# Patient Record
Sex: Female | Born: 1950
Health system: Southern US, Community
[De-identification: ages and names within clinical notes are randomized; demographics above are authoritative.]

## PROBLEM LIST (undated history)

## (undated) DIAGNOSIS — R011 Cardiac murmur, unspecified: Secondary | ICD-10-CM

## (undated) DIAGNOSIS — Z8719 Personal history of other diseases of the digestive system: Secondary | ICD-10-CM

## (undated) DIAGNOSIS — T7840XA Allergy, unspecified, initial encounter: Secondary | ICD-10-CM

## (undated) DIAGNOSIS — F419 Anxiety disorder, unspecified: Secondary | ICD-10-CM

## (undated) DIAGNOSIS — J189 Pneumonia, unspecified organism: Secondary | ICD-10-CM

## (undated) DIAGNOSIS — M810 Age-related osteoporosis without current pathological fracture: Secondary | ICD-10-CM

## (undated) DIAGNOSIS — I493 Ventricular premature depolarization: Secondary | ICD-10-CM

## (undated) DIAGNOSIS — E785 Hyperlipidemia, unspecified: Secondary | ICD-10-CM

## (undated) DIAGNOSIS — I10 Essential (primary) hypertension: Secondary | ICD-10-CM

## (undated) DIAGNOSIS — K219 Gastro-esophageal reflux disease without esophagitis: Secondary | ICD-10-CM

## (undated) DIAGNOSIS — K649 Unspecified hemorrhoids: Secondary | ICD-10-CM

## (undated) HISTORY — DX: Hyperlipidemia, unspecified: E78.5

## (undated) HISTORY — PX: ABDOMINAL HYSTERECTOMY: SHX81

## (undated) HISTORY — DX: Anxiety disorder, unspecified: F41.9

## (undated) HISTORY — DX: Cardiac murmur, unspecified: R01.1

## (undated) HISTORY — DX: Personal history of other diseases of the digestive system: Z87.19

## (undated) HISTORY — DX: Ventricular premature depolarization: I49.3

## (undated) HISTORY — DX: Allergy, unspecified, initial encounter: T78.40XA

## (undated) HISTORY — DX: Age-related osteoporosis without current pathological fracture: M81.0

## (undated) HISTORY — PX: OTHER SURGICAL HISTORY: SHX169

---

## 1999-12-26 ENCOUNTER — Emergency Department (HOSPITAL_COMMUNITY): Admission: EM | Admit: 1999-12-26 | Discharge: 1999-12-26 | Payer: Self-pay | Admitting: Emergency Medicine

## 2000-02-08 ENCOUNTER — Encounter: Admission: RE | Admit: 2000-02-08 | Discharge: 2000-02-08 | Payer: Self-pay | Admitting: *Deleted

## 2000-02-08 ENCOUNTER — Encounter: Payer: Self-pay | Admitting: *Deleted

## 2000-02-16 ENCOUNTER — Encounter: Admission: RE | Admit: 2000-02-16 | Discharge: 2000-03-01 | Payer: Self-pay | Admitting: Internal Medicine

## 2001-01-17 ENCOUNTER — Encounter: Admission: RE | Admit: 2001-01-17 | Discharge: 2001-01-17 | Payer: Self-pay | Admitting: Internal Medicine

## 2012-02-07 DIAGNOSIS — J189 Pneumonia, unspecified organism: Secondary | ICD-10-CM

## 2012-02-07 DIAGNOSIS — Z0271 Encounter for disability determination: Secondary | ICD-10-CM

## 2012-02-07 HISTORY — DX: Pneumonia, unspecified organism: J18.9

## 2012-02-23 ENCOUNTER — Emergency Department (HOSPITAL_COMMUNITY): Payer: BC Managed Care – PPO

## 2012-02-23 ENCOUNTER — Observation Stay (HOSPITAL_COMMUNITY)
Admission: EM | Admit: 2012-02-23 | Discharge: 2012-02-23 | Discharge status: Against Medical Advice | Payer: BC Managed Care – PPO | Attending: Emergency Medicine | Admitting: Emergency Medicine

## 2012-02-23 ENCOUNTER — Encounter (HOSPITAL_COMMUNITY): Payer: Self-pay | Admitting: *Deleted

## 2012-02-23 DIAGNOSIS — R109 Unspecified abdominal pain: Secondary | ICD-10-CM

## 2012-02-23 DIAGNOSIS — I10 Essential (primary) hypertension: Secondary | ICD-10-CM | POA: Insufficient documentation

## 2012-02-23 DIAGNOSIS — R42 Dizziness and giddiness: Secondary | ICD-10-CM | POA: Insufficient documentation

## 2012-02-23 DIAGNOSIS — R1013 Epigastric pain: Principal | ICD-10-CM | POA: Insufficient documentation

## 2012-02-23 DIAGNOSIS — Z8249 Family history of ischemic heart disease and other diseases of the circulatory system: Secondary | ICD-10-CM | POA: Insufficient documentation

## 2012-02-23 HISTORY — DX: Essential (primary) hypertension: I10

## 2012-02-23 LAB — COMPREHENSIVE METABOLIC PANEL
Alkaline Phosphatase: 76 U/L (ref 39–117)
BUN: 12 mg/dL (ref 6–23)
CO2: 28 mEq/L (ref 19–32)
GFR calc Af Amer: >90 mL/min (ref >90)
GFR calc non Af Amer: >90 mL/min (ref >90)
Glucose, Bld: 99 mg/dL (ref 70–99)
Potassium: 3.7 mEq/L (ref 3.5–5.1)
Total Bilirubin: 0.3 mg/dL (ref 0.3–1.2)
Total Protein: 7.4 g/dL (ref 6.0–8.3)

## 2012-02-23 LAB — CBC
HCT: 39.7 % (ref 36.0–46.0)
Hemoglobin: 13.7 g/dL (ref 12.0–15.0)
MCHC: 34.5 g/dL (ref 30.0–36.0)

## 2012-02-23 LAB — TROPONIN I: Troponin I: <0.3 ng/mL (ref <0.30)

## 2012-02-23 MED ORDER — SODIUM CHLORIDE 0.9 % IV BOLUS (SEPSIS)
1000.0000 mL | Freq: Once | INTRAVENOUS | Status: AC
  Administered 2012-02-23: 1000 mL via INTRAVENOUS

## 2012-02-23 MED ORDER — ASPIRIN 81 MG PO CHEW
162.0000 mg | CHEWABLE_TABLET | Freq: Once | ORAL | Status: AC
  Administered 2012-02-23: 162 mg via ORAL
  Filled 2012-02-23: qty 2

## 2012-02-23 MED ORDER — ASPIRIN EC 325 MG PO TBEC: 325.0000 mg | DELAYED_RELEASE_TABLET | Freq: Every day | ORAL | Status: DC

## 2012-02-23 MED ORDER — FAMOTIDINE 20 MG PO TABS: 20.0000 mg | ORAL_TABLET | Freq: Two times a day (BID) | ORAL | Status: DC

## 2012-02-23 NOTE — ED Provider Notes (Addendum)
History     CSN: [id number]  Arrival date & time 02/23/12  1245   First MD Initiated Contact with Patient 02/23/12 1346      Chief Complaint  Patient presents with  . Dizziness    (Consider location/radiation/quality/duration/timing/severity/associated sxs/prior treatment) HPI Comments: Pt comes in with cc of dizziness and some epigastric discomfort. Pt has been having this epigastric discomfort for 2 weeks now, they are intermittent episodes, not related to po intake and with no specific aggravating or relieving factors associated with them. There is no associated n/v, but she does get dizzy (described as lightheadedness), and some clammy/diophoretic feeling with them. There is no cough, no sob, no hx of PE, DVT, or risk factors for the same. She had seen a pcp for this, and was started on some GI cocktail and was informed that if her sx dont resolve, she soul consider going to the ER. No previous cardiac testing, social hx is negative, family hx of CAD (father - 10s-70s).  The history is provided by the patient.    Past Medical History  Diagnosis Date  . Hypertension     Past Surgical History  Procedure Date  . Bunyonectomy   . Abdominal hysterectomy     No family history on file.  History  Substance Use Topics  . Smoking status: Never Smoker   . Smokeless tobacco: Not on file  . Alcohol Use: Yes     occa    OB History    Grav Para Term Preterm Abortions TAB SAB Ect Mult Living                  Review of Systems  Constitutional: Negative for activity change.  HENT: Negative for neck pain.   Respiratory: Negative for shortness of breath.   Cardiovascular: Negative for chest pain.  Gastrointestinal: Negative for nausea, vomiting and abdominal pain.  Genitourinary: Negative for dysuria.  Neurological: Positive for dizziness and light-headedness. Negative for tremors, syncope, weakness and headaches.    Allergies  Review of patient's allergies indicates  no known allergies.  Home Medications   Current Outpatient Rx  Name Route Sig Dispense Refill  . ACETAMINOPHEN 500 MG PO TABS Oral Take 500 mg by mouth every 6 (six) hours as needed. headache    . LOSARTAN POTASSIUM 25 MG PO TABS Oral Take 25 mg by mouth daily.    . NEOMYCIN-POLYMYXIN-DEXAMETH 3.5-10000-0.1 OP SUSP  1 drop every 6 (six) hours. For 7 days starting on 10/12      BP 131/87  Pulse 76  Temp 98.8 F (37.1 C) (Oral)  Resp 20  SpO2 98%  Physical Exam  Nursing note and vitals reviewed. Constitutional: She is oriented to person, place, and time. She appears well-developed and well-nourished.  HENT:  Head: Normocephalic and atraumatic.  Eyes: EOM are normal. Pupils are equal, round, and reactive to light.  Neck: Neck supple.  Cardiovascular: Normal rate, regular rhythm and normal heart sounds.   No murmur heard. Pulmonary/Chest: Effort normal. No respiratory distress.  Abdominal: Soft. She exhibits no distension. There is no tenderness. There is no rebound and no guarding.  Neurological: She is alert and oriented to person, place, and time.  Skin: Skin is warm and dry.    ED Course  Procedures (including critical care time)   Labs Reviewed  CBC  COMPREHENSIVE METABOLIC PANEL   No results found.   No diagnosis found.    MDM   Date: 02/23/2012  Rate: 71  Rhythm: normal  sinus rhythm  QRS Axis: normal  Intervals: normal  ST/T Wave abnormalities: normal  Conduction Disutrbances: LAFB  Narrative Interpretation: unremarkable, no comparison  Differential diagnosis includes: ACS syndrome CHF exacerbation Valvular disorder Myocarditis Pericarditis Pericardial effusion Pneumonia Pleural effusion Pulmonary edema PE Anemia Musculoskeletal pain  Pt comes in with cc of epigastric discomfort. She has been having these sx for 2 weeks, saw a doctor who gave her GI meds, and stated that if sx didn't respond she should come to the ED. Pt has intermittent sx,  not consistent with GERD. The sx truly are very atypical, but she has asspciated clammy feeling, dizziness, nervous feeling. No concerns for PE clinically. Cardiac risk factors  - Age, HTN and family hx of CAD in the 17s and 25s with no previous workup. Probably a good candidate for chest pain protocol. Will r/o orthostasis for her dizziness (no syncope).  Derwood Kaplan, MD 02/23/12 1347  CXR shows some concerns for pulm HTN. I think the CT angio chest should help better differentiate this as well. Transferring to [facility] - chest pain protocol. PCP f/u is on Tuesday per patient.  Derwood Kaplan, MD 02/23/12 1700

## 2012-02-23 NOTE — ED Notes (Signed)
Pt states she does not want to stay and be treated any longer. States she feels fine and wants to go home. Pt denies chest pain, nausea, shortness of breath, or dizziness. VSS. MD notified.

## 2012-02-23 NOTE — ED Notes (Addendum)
Pt reports intermittent dizziness, chest tightness, and feeling tired x 2 weeks now.  Reports being seen at [facility] last week for same.  Pt reports she was given eye gtts without relief and has not felt better.  Pt also reports chest tightness and palpitation but states that it's located in her epigastric area "like indigestion."  Pt also reports blurred vision.

## 2012-02-23 NOTE — ED Notes (Signed)
Pt denies chest pain at present. Denies shortness of breath, nausea.

## 2012-02-23 NOTE — ED Notes (Signed)
MD at bedside. Dr. Bednar at bedside.  

## 2012-03-05 ENCOUNTER — Other Ambulatory Visit: Payer: Self-pay

## 2012-03-05 ENCOUNTER — Encounter (HOSPITAL_COMMUNITY): Payer: Self-pay | Admitting: *Deleted

## 2012-03-05 ENCOUNTER — Observation Stay (HOSPITAL_COMMUNITY)
Admission: EM | Admit: 2012-03-05 | Discharge: 2012-03-07 | Disposition: A | Discharge status: Home / Self Care | Payer: BC Managed Care – PPO | Attending: Family Medicine | Admitting: Family Medicine

## 2012-03-05 DIAGNOSIS — Z792 Long term (current) use of antibiotics: Secondary | ICD-10-CM | POA: Insufficient documentation

## 2012-03-05 DIAGNOSIS — R1013 Epigastric pain: Secondary | ICD-10-CM | POA: Insufficient documentation

## 2012-03-05 DIAGNOSIS — J189 Pneumonia, unspecified organism: Secondary | ICD-10-CM | POA: Insufficient documentation

## 2012-03-05 DIAGNOSIS — R5383 Other fatigue: Secondary | ICD-10-CM | POA: Insufficient documentation

## 2012-03-05 DIAGNOSIS — R079 Chest pain, unspecified: Secondary | ICD-10-CM | POA: Insufficient documentation

## 2012-03-05 DIAGNOSIS — R42 Dizziness and giddiness: Secondary | ICD-10-CM

## 2012-03-05 DIAGNOSIS — R55 Syncope and collapse: Principal | ICD-10-CM | POA: Diagnosis present

## 2012-03-05 DIAGNOSIS — R011 Cardiac murmur, unspecified: Secondary | ICD-10-CM | POA: Diagnosis present

## 2012-03-05 DIAGNOSIS — R5381 Other malaise: Secondary | ICD-10-CM | POA: Insufficient documentation

## 2012-03-05 DIAGNOSIS — M1990 Unspecified osteoarthritis, unspecified site: Secondary | ICD-10-CM | POA: Insufficient documentation

## 2012-03-05 DIAGNOSIS — Z23 Encounter for immunization: Secondary | ICD-10-CM | POA: Insufficient documentation

## 2012-03-05 DIAGNOSIS — I10 Essential (primary) hypertension: Secondary | ICD-10-CM | POA: Insufficient documentation

## 2012-03-05 HISTORY — DX: Gastro-esophageal reflux disease without esophagitis: K21.9

## 2012-03-05 HISTORY — DX: Pneumonia, unspecified organism: J18.9

## 2012-03-05 MED ORDER — GI COCKTAIL ~~LOC~~
30.0000 mL | Freq: Once | ORAL | Status: AC
  Administered 2012-03-06: 30 mL via ORAL
  Filled 2012-03-05: qty 30

## 2012-03-05 NOTE — ED Notes (Signed)
The pt is c/o of dizziness and feeling unwell for over one month. She has been seen at the [facility] and also dr bland with a diagnosis of pneumonia.  No distress at present

## 2012-03-06 ENCOUNTER — Emergency Department (HOSPITAL_COMMUNITY): Payer: BC Managed Care – PPO

## 2012-03-06 ENCOUNTER — Encounter (HOSPITAL_COMMUNITY): Payer: Self-pay | Admitting: Internal Medicine

## 2012-03-06 DIAGNOSIS — R55 Syncope and collapse: Principal | ICD-10-CM

## 2012-03-06 DIAGNOSIS — R42 Dizziness and giddiness: Secondary | ICD-10-CM

## 2012-03-06 DIAGNOSIS — R011 Cardiac murmur, unspecified: Secondary | ICD-10-CM

## 2012-03-06 DIAGNOSIS — R079 Chest pain, unspecified: Secondary | ICD-10-CM

## 2012-03-06 LAB — COMPREHENSIVE METABOLIC PANEL
Albumin: 4.1 g/dL (ref 3.5–5.2)
Alkaline Phosphatase: 89 U/L (ref 39–117)
BUN: 19 mg/dL (ref 6–23)
Calcium: 9.7 mg/dL (ref 8.4–10.5)
GFR calc Af Amer: >90 mL/min (ref >90)
Glucose, Bld: 132 mg/dL — ABNORMAL HIGH (ref 70–99)
Potassium: 3.3 mEq/L — ABNORMAL LOW (ref 3.5–5.1)
Sodium: 134 mEq/L — ABNORMAL LOW (ref 135–145)
Total Protein: 7.8 g/dL (ref 6.0–8.3)

## 2012-03-06 LAB — CBC WITH DIFFERENTIAL/PLATELET
Basophils Relative: 0 % (ref 0–1)
Eosinophils Absolute: 0.2 10*3/uL (ref 0.0–0.7)
Eosinophils Relative: 3 % (ref 0–5)
MCH: 31 pg (ref 26.0–34.0)
MCHC: 35.8 g/dL (ref 30.0–36.0)
MCV: 86.6 fL (ref 78.0–100.0)
Monocytes Relative: 9 % (ref 3–12)
Neutrophils Relative %: 48 % (ref 43–77)
Platelets: 325 10*3/uL (ref 150–400)

## 2012-03-06 LAB — CBC
HCT: 37.9 % (ref 36.0–46.0)
Hemoglobin: 13.5 g/dL (ref 12.0–15.0)
MCHC: 35.6 g/dL (ref 30.0–36.0)
RBC: 4.31 MIL/uL (ref 3.87–5.11)
WBC: 6.8 10*3/uL (ref 4.0–10.5)

## 2012-03-06 LAB — CREATININE, SERUM
Creatinine, Ser: 0.66 mg/dL (ref 0.50–1.10)
GFR calc Af Amer: >90 mL/min (ref >90)
GFR calc non Af Amer: >90 mL/min (ref >90)

## 2012-03-06 LAB — TROPONIN I: Troponin I: <0.3 ng/mL (ref <0.30)

## 2012-03-06 MED ORDER — ONDANSETRON HCL 4 MG/2ML IJ SOLN
4.0000 mg | Freq: Once | INTRAMUSCULAR | Status: AC
  Administered 2012-03-06: 4 mg via INTRAVENOUS
  Filled 2012-03-06: qty 2

## 2012-03-06 MED ORDER — FAMOTIDINE 20 MG PO TABS
20.0000 mg | ORAL_TABLET | Freq: Two times a day (BID) | ORAL | Status: DC
  Administered 2012-03-06 – 2012-03-07 (×3): 20 mg via ORAL
  Filled 2012-03-06 (×4): qty 1

## 2012-03-06 MED ORDER — SODIUM CHLORIDE 0.9 % IV BOLUS (SEPSIS)
1000.0000 mL | Freq: Once | INTRAVENOUS | Status: AC
  Administered 2012-03-06: 1000 mL via INTRAVENOUS

## 2012-03-06 MED ORDER — HEPARIN SODIUM (PORCINE) 5000 UNIT/ML IJ SOLN
5000.0000 [IU] | Freq: Three times a day (TID) | INTRAMUSCULAR | Status: DC
  Administered 2012-03-06 – 2012-03-07 (×3): 5000 [IU] via SUBCUTANEOUS
  Filled 2012-03-06 (×6): qty 1

## 2012-03-06 MED ORDER — AZITHROMYCIN 250 MG PO TABS
250.0000 mg | ORAL_TABLET | Freq: Every day | ORAL | Status: DC
  Administered 2012-03-06 – 2012-03-07 (×2): 250 mg via ORAL
  Filled 2012-03-06 (×2): qty 1

## 2012-03-06 MED ORDER — ACETAMINOPHEN 500 MG PO TABS
500.0000 mg | ORAL_TABLET | Freq: Four times a day (QID) | ORAL | Status: DC | PRN
  Filled 2012-03-06: qty 1

## 2012-03-06 MED ORDER — ASPIRIN EC 325 MG PO TBEC
325.0000 mg | DELAYED_RELEASE_TABLET | Freq: Every day | ORAL | Status: DC
  Administered 2012-03-06 – 2012-03-07 (×2): 325 mg via ORAL
  Filled 2012-03-06 (×2): qty 1

## 2012-03-06 MED ORDER — PNEUMOCOCCAL VAC POLYVALENT 25 MCG/0.5ML IJ INJ
0.5000 mL | INJECTION | INTRAMUSCULAR | Status: AC
  Administered 2012-03-07: 0.5 mL via INTRAMUSCULAR
  Filled 2012-03-06: qty 0.5

## 2012-03-06 MED ORDER — ATROPINE SULFATE 0.1 MG/ML IJ SOLN
INTRAMUSCULAR | Status: AC
  Filled 2012-03-06: qty 10

## 2012-03-06 MED ORDER — SODIUM CHLORIDE 0.9 % IV BOLUS (SEPSIS)
500.0000 mL | Freq: Once | INTRAVENOUS | Status: AC
  Administered 2012-03-06: 500 mL via INTRAVENOUS

## 2012-03-06 MED ORDER — INFLUENZA VIRUS VACC SPLIT PF IM SUSP
0.5000 mL | INTRAMUSCULAR | Status: AC
  Administered 2012-03-07: 0.5 mL via INTRAMUSCULAR
  Filled 2012-03-06: qty 0.5

## 2012-03-06 MED ORDER — SODIUM CHLORIDE 0.9 % IJ SOLN
3.0000 mL | Freq: Two times a day (BID) | INTRAMUSCULAR | Status: DC
  Administered 2012-03-06: 3 mL via INTRAVENOUS

## 2012-03-06 MED ORDER — SODIUM CHLORIDE 0.9 % IV SOLN
INTRAVENOUS | Status: AC
  Administered 2012-03-06: 13:00:00 via INTRAVENOUS

## 2012-03-06 NOTE — ED Notes (Signed)
Patient currently resting quietly in bed; no respiratory or acute distress noted.  Patient updated on plan of care; informed patient that we are currently waiting on x-ray results to come back.  Patient has no other questions or concerns at this time; will continue to monitor.

## 2012-03-06 NOTE — ED Notes (Signed)
Dr. Bonk at bedside 

## 2012-03-06 NOTE — ED Notes (Signed)
Admitting MD at bedside.

## 2012-03-06 NOTE — ED Notes (Addendum)
While RN attempting to start IV access to administer Zofran, patient became very diaphoretic, clammy, blood pressure dropped (60 systolic), and HR dropped (40s-50s).  EDP notified and at bedside.  Patient difficult to arouse at this time.  Patient reports that this is similar to these "episodes" that she has been getting during the last month.  Repeat EKG done.  1 liter NS bolus started, per EDP verbal order verified by read back.  Non-rebreather placed on patient at 15 liters per minute.

## 2012-03-06 NOTE — ED Notes (Signed)
Patient currently resting quietly in bed; no respiratory or acute distress noted.  Patient updated on plan of care; informed patient that we are currently waiting on further orders from EDP.  Patient denies any needs at this time; will continue to monitor. 

## 2012-03-06 NOTE — ED Notes (Signed)
Patient has bee placed on Zoll pads; patient still diaphoretic and clammy at this time.  Coming in and out of consciousness.

## 2012-03-06 NOTE — ED Notes (Signed)
Normal Saline 500 mL bolus finished infusing.

## 2012-03-06 NOTE — ED Provider Notes (Signed)
History     CSN: [id number]  Arrival date & time 03/05/12  2322   First MD Initiated Contact with Patient 03/05/12 2337      Chief Complaint  Patient presents with  . Chest Pain    (Consider location/radiation/quality/duration/timing/severity/associated sxs/prior treatment) HPI Victoria Hogan is a 61 y.o. female present with a one-month history of dizziness, general malaise, epigastric "pressure" was also been evaluated 2 weeks ago in emergency department. That time she had cardiac workup which was negative-she chose to pursue an outpatient workup and saw her primary care physician. Primary care physician changed her blood pressure medication to Tribenzor - however her symptoms persisted. She says his pressures a 6-7/10, it is fairly constant, and does wane occasionally,has no known exacerbating or alleviating factors. Is been some associated nausea. She has been dizzy denies any sweats, radiation. No vomiting no diarrhea, no fevers no chills. Eyes any focal neurologic deficits, difficulties walking, talking or lightheadedness.   Past Medical History  Diagnosis Date  . Hypertension     Past Surgical History  Procedure Date  . Bunyonectomy   . Abdominal hysterectomy     No family history on file.  History  Substance Use Topics  . Smoking status: Never Smoker   . Smokeless tobacco: Not on file  . Alcohol Use: Yes     occa    OB History    Grav Para Term Preterm Abortions TAB SAB Ect Mult Living                  Review of Systems At least 10pt or greater review of systems completed and are negative except where specified in the HPI.  Allergies  Review of patient's allergies indicates no known allergies.  Home Medications   Current Outpatient Rx  Name Route Sig Dispense Refill  . ACETAMINOPHEN 500 MG PO TABS Oral Take 500 mg by mouth every 6 (six) hours as needed. headache    . ASPIRIN EC 325 MG PO TBEC Oral Take 1 tablet (325 mg total) by mouth daily. 30 tablet 0    . AZITHROMYCIN 250 MG PO TABS Oral Take 250 mg by mouth daily.    Marland Kitchen FAMOTIDINE 20 MG PO TABS Oral Take 1 tablet (20 mg total) by mouth 2 (two) times daily. 10 tablet 0  . NEOMYCIN-POLYMYXIN-DEXAMETH 3.5-10000-0.1 OP SUSP  1 drop every 6 (six) hours. For 7 days starting on 10/12    . OLMESARTAN-AMLODIPINE-HCTZ 20-5-12.5 MG PO TABS Oral Take by mouth.      BP 133/98  Pulse 92  Temp 98.1 F (36.7 C) (Oral)  Resp 19  SpO2 98%  Physical Exam  Nursing notes reviewed.  Electronic medical record reviewed. VITAL SIGNS:   Filed Vitals:   03/06/12 0430 03/06/12 0514 03/06/12 0515 03/06/12 0627  BP: 109/55 98/57 98/57  101/64  Pulse: 83 66 65 82  Temp:  98.3 F (36.8 C)    TempSrc:  Oral    Resp: 13 14 13 20   SpO2: 100% 100% 100% 98%   CONSTITUTIONAL: Awake, oriented, appears non-toxic HENT: Atraumatic, normocephalic, oral mucosa pink and moist, airway patent. Nares patent without drainage. External ears normal. EYES: Conjunctiva clear, EOMI, PERRLA NECK: Trachea midline, non-tender, supple CARDIOVASCULAR: Normal heart rate, Normal rhythm, 2/6 diastolic murmur left upper sternal border, no rubs or gallops PULMONARY/CHEST: Clear to auscultation, no rhonchi, wheezes, or rales. Symmetrical breath sounds. Non-tender. ABDOMINAL: Non-distended, soft, non-tender - no rebound or guarding.  BS normal. NEUROLOGIC: Non-focal, moving all four  extremities, no gross sensory or motor deficits. EXTREMITIES: No clubbing, cyanosis, or edema SKIN: Warm, Dry, No erythema, No rash   ED Course  CRITICAL CARE Performed by: Jones Skene Authorized by: Jones Skene Total critical care time: 30 minutes Critical care time was exclusive of separately billable procedures and treating other patients. Critical care was necessary to treat or prevent imminent or life-threatening deterioration of the following conditions: circulatory failure. Critical care was time spent personally by me on the following  activities: discussions with consultants, development of treatment plan with patient or surrogate, examination of patient, ordering and performing treatments and interventions, re-evaluation of patient's condition, review of old charts, pulse oximetry, ordering and review of laboratory studies, obtaining history from patient or surrogate and evaluation of patient's response to treatment.   (including critical care time)   Date: 03/06/2012  Rate: 76  Rhythm: sinus rhythm  QRS Axis: LAD  Intervals: normal  ST/T Wave abnormalities: normal  Conduction Disutrbances: LAFB  Narrative Interpretation: Nonacute EKG, nonischemic, no significant changes when compared with prior EKG dated 02/23/2012    Labs Reviewed  CBC WITH DIFFERENTIAL - Abnormal; Notable for the following:    RDW 11.4 (*)     All other components within normal limits  COMPREHENSIVE METABOLIC PANEL - Abnormal; Notable for the following:    Sodium 134 (*)     Potassium 3.3 (*)     Glucose, Bld 132 (*)     GFR calc non Af Amer 90 (*)     All other components within normal limits  TROPONIN I  CBC  CREATININE, SERUM  TROPONIN I  TROPONIN I  TROPONIN I   Dg Chest 2 View  03/06/2012  *RADIOLOGY REPORT*  Clinical Data: Chest pain and a T.  CHEST - 2 VIEW  Comparison: Plain films of the chest 02/23/2012.  Findings: Streaky of opacities are identified in the lingula and right middle lobe.  Heart size is normal.  No pneumothorax or pleural fluid is identified.  IMPRESSION: Streaky opacities in the lingula and right middle lobe could be due to atelectasis or possibly scar.  Pneumonia is felt less likely.   Original Report Authenticated By: Bernadene Bell. D'ALESSIO, M.D.      1. Chest pain   2. Syncope   3. Dizziness   4. Heart murmur       MDM  Victoria Hogan is a 61 y.o. female presenting to the emergency department for a epigastric pressure is been going on for about 4 weeks however is been associated with more dizziness  lightheadedness and general malaise over the course of the last couple of days. She was evaluated previously in emergency department and also obtained an outpatient CT. There are some concerns from the CT and prior chest x-rays that she may have some pulmonary hypertension - CT obtained of the chest was not with contrast. At this point I do not think she has got a pulmonary embolism, pretest probability is exceedingly low, I do not think she's got acute coronary syndrome either. EKG shows pulmonary type disease pattern with a mild left axis deviation but nothing acute.  Obtain some basic labs and a chest x-ray. Patient has been treated for pneumonia that was seen on CT.  During the patient's ER visit, she became extremely bradycardic into the upper 20s and 30s diaphoretic and unresponsive. Patient was given fluid bolus and recovered prior to receiving atropine. No other arrhythmias noted, second EKG showed sinus bradycardia.  Troponins negative.  Admit  patient for chest pain rule out - discussed with Dr. Bennie Pierini, MD 03/06/12 [phone]

## 2012-03-06 NOTE — ED Notes (Signed)
IV team called for second IV start; multiple RNs attempted with no success.

## 2012-03-06 NOTE — ED Notes (Signed)
Patient alert and oriented x4 at this time; speaking in full sentences.  Attempting second IV start at this time.

## 2012-03-06 NOTE — H&P (Signed)
[facility] History and Physical  Victoria Hogan:[id number] DOB: 1951-01-10 DOA: 03/05/2012  Referring physician: ED PCP: No primary provider on file.  Specialists: None  Chief Complaint: Presyncope  HPI: Victoria Hogan is a 61 y.o. female who presents with complaints of lightheadedness and feeling like she was going to pass out for the past 3 weeks, symptoms worsened since her blood pressure medication was increased on Wed of this week.  Symptoms are worse when going from a seated to a standing position, and were dramatically worse when they drew blood in the ED (actually had vasovagal syncopal episode in ED while on monitor while having blood drawn).  The ED has asked that hospitalist service admit the patient for her ongoing presyncope for a cardiac workup.  Review of Systems: patient denies CP, DOE, arm pain, jaw pain, has been having mild SOB recently as she was diagnosed with PNA on Friday and started on zithromax for this 12 systems reviewed and otherwise negative.  Past Medical History  Diagnosis Date  . Hypertension    Past Surgical History  Procedure Date  . Bunyonectomy   . Abdominal hysterectomy    Social History:  reports that she has never smoked. She does not have any smokeless tobacco history on file. She reports that she drinks alcohol. She reports that she does not use illicit drugs.   No Known Allergies  Family History  Problem Relation Age of Onset  . Heart failure Father     Prior to Admission medications   Medication Sig Start Date End Date Taking? Authorizing Provider  acetaminophen (TYLENOL) 500 MG tablet Take 500 mg by mouth every 6 (six) hours as needed. headache   Yes Historical Provider, MD  aspirin EC 325 MG tablet Take 1 tablet (325 mg total) by mouth daily. 02/23/12  Yes Hurman Horn, MD  azithromycin (ZITHROMAX) 250 MG tablet Take 250 mg by mouth daily.   Yes Historical Provider, MD  famotidine (PEPCID) 20 MG tablet Take 1 tablet (20  mg total) by mouth 2 (two) times daily. 02/23/12  Yes Hurman Horn, MD  neomycin-polymyxin b-dexamethasone (MAXITROL) 3.5-10000-0.1 SUSP 1 drop every 6 (six) hours. For 7 days starting on 10/12   Yes Historical Provider, MD  Olmesartan-Amlodipine-HCTZ (TRIBENZOR) 20-5-12.5 MG TABS Take by mouth.   Yes Historical Provider, MD   Physical Exam: Filed Vitals:   03/06/12 0430 03/06/12 0514 03/06/12 0515 03/06/12 0627  BP: 109/55 98/57 98/57  101/64  Pulse: 83 66 65 82  Temp:  98.3 F (36.8 C)    TempSrc:  Oral    Resp: 13 14 13 20   SpO2: 100% 100% 100% 98%    General:  NAD, resting comfortably in bed Eyes: PEERLA EOMI ENT: mucous membranes moist Neck: supple w/o JVD Cardiovascular: RRR there is a 3/6 SEM present, best heard at the L second intercostal space. Respiratory: CTA B Abdomen: soft, nt, nd, bs+ Skin: no rash nor lesion Musculoskeletal: MAE, full ROM all 4 extremities Psychiatric: normal tone and affect Neurologic: AAOx3, grossly non-focal  Labs on Admission:  Basic Metabolic Panel:  Lab 03/05/12 1478  NA 134*  K 3.3*  CL 97  CO2 25  GLUCOSE 132*  BUN 19  CREATININE 0.74  CALCIUM 9.7  MG --  PHOS --   Liver Function Tests:  Lab 03/05/12 2335  AST 16  ALT 18  ALKPHOS 89  BILITOT 0.4  PROT 7.8  ALBUMIN 4.1   No results found for this basename: LIPASE:5,AMYLASE:5  in the last 168 hours No results found for this basename: AMMONIA:5 in the last 168 hours CBC:  Lab 03/05/12 2335  WBC 6.3  NEUTROABS 3.0  HGB 14.6  HCT 40.8  MCV 86.6  PLT 325   Cardiac Enzymes:  Lab 03/05/12 2336  CKTOTAL --  CKMB --  CKMBINDEX --  TROPONINI <0.30    BNP (last 3 results) No results found for this basename: PROBNP:3 in the last 8760 hours CBG: No results found for this basename: GLUCAP:5 in the last 168 hours  Radiological Exams on Admission: Dg Chest 2 View  03/06/2012  *RADIOLOGY REPORT*  Clinical Data: Chest pain and a T.  CHEST - 2 VIEW  Comparison: Plain  films of the chest 02/23/2012.  Findings: Streaky of opacities are identified in the lingula and right middle lobe.  Heart size is normal.  No pneumothorax or pleural fluid is identified.  IMPRESSION: Streaky opacities in the lingula and right middle lobe could be due to atelectasis or possibly scar.  Pneumonia is felt less likely.   Original Report Authenticated By: Bernadene Bell. D'ALESSIO, M.D.     EKG: Independently reviewed.  Assessment/Plan Principal Problem:  *Syncope Active Problems:  Heart murmur   1. Presyncope at home with single vasovagal syncopal episode in ED - patients SBP in ED is only in the 90s to low 100s this while lying down, will order orthostatic vitals but likely has orthostatic hypotension given history and the fact she is on HCTZ which is well known to cause this, holding BP meds as I think these may be at the root cause of her symptoms.  Will also order serial troponins but I doubt ACS. 2. Heart murmur - no history of previous heart murmur, very possibly just a flow murmur given the low BP on exam but will order 2d echo never the less to make sure there isnt an underlying valve problem that could be responsible for her symptoms. 3. PNA diagnosed on Friday - will continue azithromycin while here.  Code Status: Full Family Communication: Spoke with patient and husband who is at bedside Disposition Plan: Admit to obs  Time spent: 50 min  [name] M. [facility] Pager [phone]  If 7PM-7AM, please contact night-coverage [email or web address] Password [email or web address] 03/06/2012, 7:02 AM

## 2012-03-06 NOTE — Progress Notes (Addendum)
[facility] PROGRESS NOTE  Victoria Hogan ZOX:[id number] DOB: November 20, 1950 DOA: 03/05/2012 PCP: Geraldo Pitter, MD  Assessment/Plan: Principal Problem:  *Syncope Active Problems:  Heart murmur    1. Presyncope/Syncope: Patient presented with  3 weeks of pre-syncopal symptoms, culminating in a vasovagal syncopal episode in ED. She was found to be significantly orthostatic. This clinical picture is consistent with volume-depletion/orthostsais, secondary to antihypertensive/diuretic. These culprit medications have been placed on hold, patient is being hydrated with iv NS, and she already feels better. She has no chest pain or SOB, and cardiac enzymes are unelevated. 12-lead EKG showed SR. 2. Heart murmur: This was an incidental finding on physical examination. 2D echocardiogram, has been done, but report is still pending. 3. PNA: Patient was diagnosed with CAP on 12/01/11, and commenced on Azithromycin, by her PMD. CXR revealed streaky opacities in the lingula and right middle lobe. Patient is on day# 5 of Azithromycin, has no cough or pyrexia, and wcc is normal. We shall complete 7-day course of antibiotic on 03/08/12.  4. History of HTN: See discussion in #1 above. Antihypertensives are on hold.   Code Status: Full Code.  Family Communication:  Disposition Plan: Possible discharge on 03/07/12.    Brief narrative: 61 y.o. female with history of HTN, TAH, s/p previous bunionectomy, recent CAP, on Azithromycin therapy since 03/02/22, presenting with lightheadedness and feeling like she was going to pass out for the past 3 weeks. These symptoms worsened since her blood pressure medication was increased on 02/29/12, and are worse when going from a seated to a standing position. In the ED, she actually had vasovagal syncopal episode in ED while on monitor, while having blood drawn. She was admitted for further evaluation and management.    Consultants:  N/A.   Procedures:  CXR.    Antibiotics:  Azithromycin 03/06/12>>>  HPI/Subjective: Feels better.   Objective: Vital signs in last 24 hours: Temp:  [98.1 F (36.7 C)-98.6 F (37 C)] 98.6 F (37 C) (10/29 1400) Pulse Rate:  [58-111] 73  (10/29 1400) Resp:  [12-20] 17  (10/29 1400) BP: (68-139)/(34-98) 98/63 mmHg (10/29 1400) SpO2:  [94 %-100 %] 97 % (10/29 1400) Weight:  [72.757 kg (160 lb 6.4 oz)] 72.757 kg (160 lb 6.4 oz) (10/29 0923) Weight change:  Last BM Date: 03/06/12  Intake/Output from previous day:   Total I/O In: 222 [P.O.:222] Out: -    Physical Exam: General: Comfortable, alert, communicative, fully oriented, not short of breath at rest.  HEENT:  No clinical pallor, no jaundice, no conjunctival injection or discharge. NECK:  Supple, JVP not seen, no carotid bruits, no palpable lymphadenopathy, no palpable goiter. CHEST:  Clinically clear to auscultation, no wheezes, no crackles. HEART:  Sounds 1 and 2 heard, normal, regular, no murmurs. ABDOMEN:  Full, soft, non-tender, no palpable organomegaly, no palpable masses, normal bowel sounds. GENITALIA:  Not examined. LOWER EXTREMITIES:  No pitting edema, palpable peripheral pulses. MUSCULOSKELETAL SYSTEM:  Generalized osteoarthritic changes, otherwise, normal. CENTRAL NERVOUS SYSTEM:  No focal neurologic deficit on gross examination.  Lab Results:  Basename 03/06/12 0714 03/05/12 2335  WBC 6.8 6.3  HGB 13.5 14.6  HCT 37.9 40.8  PLT 305 325    Basename 03/06/12 0714 03/05/12 2335  NA -- 134*  K -- 3.3*  CL -- 97  CO2 -- 25  GLUCOSE -- 132*  BUN -- 19  CREATININE 0.66 0.74  CALCIUM -- 9.7   No results found for this or any previous visit (from the past  240 hour(s)).   Studies/Results: Dg Chest 2 View  03/06/2012  *RADIOLOGY REPORT*  Clinical Data: Chest pain and a T.  CHEST - 2 VIEW  Comparison: Plain films of the chest 02/23/2012.  Findings: Streaky of opacities are identified in the lingula and right middle lobe.  Heart  size is normal.  No pneumothorax or pleural fluid is identified.  IMPRESSION: Streaky opacities in the lingula and right middle lobe could be due to atelectasis or possibly scar.  Pneumonia is felt less likely.   Original Report Authenticated By: Bernadene Bell. Maricela Curet, M.D.     Medications: Scheduled Meds:    . aspirin EC  325 mg Oral Daily  . atropine      . azithromycin  250 mg Oral Daily  . famotidine  20 mg Oral BID  . gi cocktail  30 mL Oral Once  . heparin  5,000 Units Subcutaneous Q8H  . influenza  inactive virus vaccine  0.5 mL Intramuscular Tomorrow-1000  . ondansetron (ZOFRAN) IV  4 mg Intravenous Once  . pneumococcal 23 valent vaccine  0.5 mL Intramuscular Tomorrow-1000  . sodium chloride  1,000 mL Intravenous Once  . sodium chloride  500 mL Intravenous Once  . sodium chloride  3 mL Intravenous Q12H   Continuous Infusions:    . sodium chloride 100 mL/hr at 03/06/12 1233   PRN Meds:.acetaminophen    LOS: 1 day   Earnest Thalman,CHRISTOPHER  [facility] Pager [phone]. If 8PM-8AM, please contact night-coverage at [email or web address], password [facility] 03/06/2012, 4:44 PM  LOS: 1 day

## 2012-03-07 LAB — BASIC METABOLIC PANEL
BUN: 9 mg/dL (ref 6–23)
CO2: 24 mEq/L (ref 19–32)
Chloride: 106 mEq/L (ref 96–112)
Creatinine, Ser: 0.7 mg/dL (ref 0.50–1.10)

## 2012-03-07 LAB — CBC
HCT: 35.3 % — ABNORMAL LOW (ref 36.0–46.0)
MCHC: 34 g/dL (ref 30.0–36.0)
MCV: 88.9 fL (ref 78.0–100.0)
RDW: 11.6 % (ref 11.5–15.5)
WBC: 5.6 10*3/uL (ref 4.0–10.5)

## 2012-03-07 MED ORDER — HYDRALAZINE HCL 10 MG PO TABS: 10.0000 mg | ORAL_TABLET | Freq: Three times a day (TID) | ORAL | Status: DC

## 2012-03-07 NOTE — Progress Notes (Signed)
DC orders received.  Patient stable with no S/S of distress.  Discharge and medication information reviewed with patient and patient's family member.  Patient DC home. Victoria Hogan

## 2012-03-07 NOTE — Evaluation (Signed)
Physical Therapy Evaluation Patient Details Name: Victoria Hogan MRN: [id number] DOB: 05-12-1950 Today's Date: 03/07/2012 Time: 4098-1191 PT Time Calculation (min): 31 min  PT Assessment / Plan / Recommendation Clinical Impression  pt presents with Orthostatic Hypotension with MD order for Vestibular eval.  Much of pt's symptoms have resolved and seem to be due to Hypotension, however pt does describe feeling woosey headed and having peripheral vision go blurry and progressively feeling worse during Hallpike testing to L.  Disucssed with MD concerns about any arterial insufficienciese eliciting these sensations as they don't seem to be Vestibular in origin.  Discussed with pt to f/u with primary MD.  No further PT needs at this time.      PT Assessment  Patent does not need any further PT services    Follow Up Recommendations  No PT follow up    Does the patient have the potential to tolerate intense rehabilitation      Barriers to Discharge None      Equipment Recommendations  None recommended by PT    Recommendations for Other Services     Frequency      Precautions / Restrictions Precautions Precautions: None Restrictions Weight Bearing Restrictions: No   Pertinent Vitals/Pain Denies pain.  Orthostatic Vitals checked: supine 100/66, sitting 106/72, and standing 108/69.        Mobility  Bed Mobility Bed Mobility: Supine to Sit;Sitting - Scoot to Edge of Bed;Sit to Supine Supine to Sit: 7: Independent Sitting - Scoot to Edge of Bed: 7: Independent Sit to Supine: 7: Independent Transfers Transfers: Sit to Stand;Stand to Sit Sit to Stand: 7: Independent;From bed Stand to Sit: 7: Independent;To bed Ambulation/Gait Ambulation/Gait Assistance: 7: Independent Ambulation Distance (Feet): 160 Feet Assistive device: None Gait Pattern: Within Functional Limits Stairs: No Wheelchair Mobility Wheelchair Mobility: No    Shoulder Instructions     Exercises     PT  Diagnosis:    PT Problem List:   PT Treatment Interventions:     PT Goals    Visit Information  Last PT Received On: 03/07/12 Assistance Needed: +1    Subjective Data  Subjective: It's hard to tell what is from my BP meds and what is different.   Patient Stated Goal: Home   Prior Functioning  Prior Function Level of Independence: Independent Able to Take Stairs?: Yes Driving: Yes Communication Communication: No difficulties    Cognition  Overall Cognitive Status: Appears within functional limits for tasks assessed/performed Arousal/Alertness: Awake/alert Orientation Level: Appears intact for tasks assessed Behavior During Session: [facility] for tasks performed    Extremity/Trunk Assessment Right Lower Extremity Assessment RLE ROM/Strength/Tone: [facility] for tasks assessed RLE Sensation: WFL - Light Touch RLE Coordination: WFL - gross/fine motor Left Lower Extremity Assessment LLE ROM/Strength/Tone: WFL for tasks assessed LLE Sensation: WFL - Light Touch LLE Coordination: WFL - gross/fine motor Trunk Assessment Trunk Assessment: Normal   Balance Balance Balance Assessed: No  End of Session PT - End of Session Activity Tolerance: Patient tolerated treatment well Patient left: in bed;with call bell/phone within reach (Sitting EOB) Nurse Communication: Mobility status  GP Functional Assessment Tool Used: Clinical Judgement Functional Limitation: Mobility: Walking and moving around Mobility: Walking and Moving Around Current Status (Y7829): 0 percent impaired, limited or restricted Mobility: Walking and Moving Around Goal Status (F6213): 0 percent impaired, limited or restricted Mobility: Walking and Moving Around Discharge Status (Y8657): 0 percent impaired, limited or restricted   Sunny Schlein, [place] [phone] 03/07/2012, 12:19 PM

## 2012-03-07 NOTE — Discharge Summary (Addendum)
Physician Discharge Summary  Victoria Hogan AVW:[id number] DOB: Aug 04, 1950 DOA: 03/05/2012  PCP: Geraldo Pitter, MD  Admit date: 03/05/2012 Discharge date: 03/07/2012  Time spent: 20 minutes  Recommendations for Outpatient Follow-up:  1. Follow blood pressure.  Would not place on diuretic/CCB unless absolutely indicated 2. Patient presented with some features of Vertigo from potentially a vestibular cause-PT/OT evaled her for this and found this non-contributory-consider out patient Carotid Duplex if symptoms persist 3. Heart murmur found to be non-contributory  Discharge Diagnoses:  Principal Problem:  *Syncope Active Problems:  Heart murmur   Discharge Condition: Good  Diet recommendation: Heart healthy  Filed Weights   03/06/12 0923  Weight: 72.757 kg (160 lb 6.4 oz)    History of present illness:  This pleasant 61 year old female presented with lightheadedness and few issues can pass out for the past 3 weeks on 03/05/2012-the symptoms worsened from seated to standing position and were dramatically worse when they drew blood in the emergency room. She denied any specific other issues other than the fact that she been diagnosed on 03/02/2029 with pneumonia and started on azithromycin. She apparently had also had a CT scan of the chest per her primary care physician which ruled this out. Please see below for full history  Hospital Course:  1. Presyncope/Syncope: Patient presented with 3 weeks of pre-syncopal symptoms, culminating in a vasovagal syncopal episode in ED. She was found to be significantly orthostatic. This clinical picture is consistent with volume-depletion/orthostsais, secondary to antihypertensive/diuretic. These culprit medications have been placed on hold, she was transitioned on discharge hydralazine 10 mg 3 times a day with instructions verbally from me that she can take 10 mg in the morning and evening and have a close followup with Dr. Parke Simmers as an outpatient.  12-lead EKG showed SR.  ?Vertigo-on exam of date discharge patient presented with vertiginous symptoms more to the left temporal and superior areas of the visual field on the left side. I have ordered and requested physical and occupational therapy Tammy do vestibular evaluation the patient patient would benefit from outpatient vestibular rehabilitation. 2. Heart murmur: This was an incidental finding on physical examination. 2D echocardiogram showed trivial mitral regurgitation and mildly thickened aortic valve leaflets however this was not thought to be contributory 3. PNA: Patient was diagnosed with CAP on 12/01/11, and commenced on Azithromycin, by her PMD. CXR revealed streaky opacities in the lingula and right middle lobe. Patient is on day# 5 of Azithromycin, has no cough or pyrexia, and wcc is normal. We shall complete 7-day course of antibiotic on 03/08/12.  4. History of HTN: See discussion in #1 above. Antihypertensives are on hold.    Procedures:  Echocardiogram done 03/06/2012 (i.e. Studies not automatically included, echos, thoracentesis, etc; not x-rays)  Chest x-ray done 03/06/2012  Consultations:  Physical an  Occupatrional therapy  Discharge Exam: Filed Vitals:   03/06/12 0944 03/06/12 1400 03/06/12 2100 03/07/12 0500  BP: 106/71 98/63 105/69 99/64  Pulse: 82 73 76 71  Temp:  98.6 F (37 C) 98.3 F (36.8 C) 98.3 F (36.8 C)  TempSrc:      Resp:  17 18 18   Height:      Weight:      SpO2:  97% 98% 100%    Discharge Instructions     Medication List     As of 03/07/2012 10:42 AM    STOP taking these medications         neomycin-polymyxin b-dexamethasone 3.5-10000-0.1 Susp   Commonly known as:  MAXITROL      TRIBENZOR 20-5-12.5 MG Tabs   Generic drug: Olmesartan-Amlodipine-HCTZ      TAKE these medications         acetaminophen 500 MG tablet   Commonly known as: TYLENOL   Take 500 mg by mouth every 6 (six) hours as needed. headache      aspirin EC 325  MG tablet   Take 1 tablet (325 mg total) by mouth daily.      azithromycin 250 MG tablet   Commonly known as: ZITHROMAX   Take 250 mg by mouth daily.      famotidine 20 MG tablet   Commonly known as: PEPCID   Take 1 tablet (20 mg total) by mouth 2 (two) times daily.      hydrALAZINE 10 MG tablet   Commonly known as: APRESOLINE   Take 1 tablet (10 mg total) by mouth 3 (three) times daily.          The results of significant diagnostics from this hospitalization (including imaging, microbiology, ancillary and laboratory) are listed below for reference.    Significant Diagnostic Studies: Dg Chest 2 View  03/06/2012  *RADIOLOGY REPORT*  Clinical Data: Chest pain and a T.  CHEST - 2 VIEW  Comparison: Plain films of the chest 02/23/2012.  Findings: Streaky of opacities are identified in the lingula and right middle lobe.  Heart size is normal.  No pneumothorax or pleural fluid is identified.  IMPRESSION: Streaky opacities in the lingula and right middle lobe could be due to atelectasis or possibly scar.  Pneumonia is felt less likely.   Original Report Authenticated By: Bernadene Bell. Maricela Curet, M.D.    Dg Chest 2 View  02/23/2012  *RADIOLOGY REPORT*  Clinical Data: Weakness.  Dizziness.  Hypertension.  CHEST - 2 VIEW  Comparison: None.  Findings: Suspected left atrial enlargement.  Possible main pulmonary arterial enlargement.  Indistinct airspace opacity along the right heart border, suspicious for lingular atelectasis or pneumonia.  Left heart border somewhat indistinct, probably from prominent epicardial adipose tissue.  The cardiothoracic ratio is within normal limits for technique.  Large lung volumes noted.  No discrete pleural effusion observed.  IMPRESSION:  1.  Mild prominence of right heart border raising suspicion for left atrial enlargement.  Equivocal splaying of the carina is a complementary finding.  Along the left heart border, left atrial appendage is not definitively enlarged,  although there is a suggestion of a potentially enlarged main pulmonary artery (query pulmonary arterial hypertension), and slight obscuration of the AP window.  Adenopathy in this region cannot be totally excluded. 2.  Mild airspace opacity in the right middle lobe suspicious for pneumonia or atelectasis. 3. Further workup findings through CTA chest and / or echocardiography may be helpful.   Original Report Authenticated By: Dellia Cloud, M.D.     Microbiology: No results found for this or any previous visit (from the past 240 hour(s)).   Labs: Basic Metabolic Panel:  Lab 03/07/12 1610 03/06/12 0714 03/05/12 2335  NA 137 -- 134*  K 3.6 -- 3.3*  CL 106 -- 97  CO2 24 -- 25  GLUCOSE 94 -- 132*  BUN 9 -- 19  CREATININE 0.70 0.66 0.74  CALCIUM 8.3* -- 9.7  MG -- -- --  PHOS -- -- --   Liver Function Tests:  Lab 03/05/12 2335  AST 16  ALT 18  ALKPHOS 89  BILITOT 0.4  PROT 7.8  ALBUMIN 4.1   No results found for  this basename: LIPASE:5,AMYLASE:5 in the last 168 hours No results found for this basename: AMMONIA:5 in the last 168 hours CBC:  Lab 03/07/12 0440 03/06/12 0714 03/05/12 2335  WBC 5.6 6.8 6.3  NEUTROABS -- -- 3.0  HGB 12.0 13.5 14.6  HCT 35.3* 37.9 40.8  MCV 88.9 87.9 86.6  PLT 257 305 325   Cardiac Enzymes:  Lab 03/06/12 2039 03/06/12 1253 03/06/12 0723 03/05/12 2336  CKTOTAL -- -- -- --  CKMB -- -- -- --  CKMBINDEX -- -- -- --  TROPONINI <0.30 <0.30 <0.30 <0.30   BNP: BNP (last 3 results) No results found for this basename: PROBNP:3 in the last 8760 hours CBG: No results found for this basename: GLUCAP:5 in the last 168 hours     Signed:  Rhetta Mura  [facility] 03/07/2012, 10:42 AM

## 2012-03-10 ENCOUNTER — Encounter (HOSPITAL_COMMUNITY): Payer: Self-pay | Admitting: Nurse Practitioner

## 2012-03-10 ENCOUNTER — Emergency Department (HOSPITAL_COMMUNITY)
Admission: EM | Admit: 2012-03-10 | Discharge: 2012-03-10 | Disposition: A | Discharge status: Home / Self Care | Payer: BC Managed Care – PPO | Attending: Emergency Medicine | Admitting: Emergency Medicine

## 2012-03-10 DIAGNOSIS — R5383 Other fatigue: Secondary | ICD-10-CM | POA: Insufficient documentation

## 2012-03-10 DIAGNOSIS — R42 Dizziness and giddiness: Secondary | ICD-10-CM | POA: Insufficient documentation

## 2012-03-10 DIAGNOSIS — R5381 Other malaise: Secondary | ICD-10-CM | POA: Insufficient documentation

## 2012-03-10 DIAGNOSIS — J189 Pneumonia, unspecified organism: Secondary | ICD-10-CM | POA: Insufficient documentation

## 2012-03-10 DIAGNOSIS — Z79899 Other long term (current) drug therapy: Secondary | ICD-10-CM | POA: Insufficient documentation

## 2012-03-10 DIAGNOSIS — K219 Gastro-esophageal reflux disease without esophagitis: Secondary | ICD-10-CM | POA: Insufficient documentation

## 2012-03-10 DIAGNOSIS — I10 Essential (primary) hypertension: Secondary | ICD-10-CM

## 2012-03-10 NOTE — ED Notes (Signed)
Pt states that she was at home and noticed that her diastolic BP was 100. Pt states that she was concerned and wanted to be checked because she has recently started on BP medications.

## 2012-03-10 NOTE — ED Provider Notes (Signed)
History     CSN: [id number]  Arrival date & time 03/10/12  1640   First MD Initiated Contact with Patient 03/10/12 1944      Chief Complaint  Patient presents with  . Hypertension     Patient is a 61 y.o. female presenting with hypertension.  Hypertension This is a recurrent problem. Episode onset: today. The problem occurs constantly. The problem has not changed since onset.Pertinent negatives include no chest pain, no abdominal pain, no headaches and no shortness of breath. Nothing aggravates the symptoms. Nothing relieves the symptoms. She has tried rest for the symptoms. The treatment provided mild relief.  pt checked BP today and it was elevated - 180/100 She felt slight dizziness and fatigue but no cp/sob.  No HA.  No visual change.  No focal weakness No abd pain Reports recent admission and change in BP meds and just started on hydralazine  Past Medical History  Diagnosis Date  . Hypertension   . Pneumonia 02/2012  . GERD (gastroesophageal reflux disease)     Past Surgical History  Procedure Date  . Bunyonectomy   . Abdominal hysterectomy     Family History  Problem Relation Age of Onset  . Heart failure Father     History  Substance Use Topics  . Smoking status: Never Smoker   . Smokeless tobacco: Never Used  . Alcohol Use: Yes     occa    OB History    Grav Para Term Preterm Abortions TAB SAB Ect Mult Living                  Review of Systems  Respiratory: Negative for shortness of breath.   Cardiovascular: Negative for chest pain.  Gastrointestinal: Negative for abdominal pain.  Neurological: Negative for headaches.  All other systems reviewed and are negative.    Allergies  Review of patient's allergies indicates no known allergies.  Home Medications   Current Outpatient Rx  Name Route Sig Dispense Refill  . HYDRALAZINE HCL 10 MG PO TABS Oral Take 1 tablet (10 mg total) by mouth 3 (three) times daily. 90 tablet 0  . ACETAMINOPHEN 500  MG PO TABS Oral Take 500 mg by mouth every 6 (six) hours as needed. headache    . ASPIRIN EC 325 MG PO TBEC Oral Take 1 tablet (325 mg total) by mouth daily. 30 tablet 0  . AZITHROMYCIN 250 MG PO TABS Oral Take 250 mg by mouth daily.    Marland Kitchen FAMOTIDINE 20 MG PO TABS Oral Take 1 tablet (20 mg total) by mouth 2 (two) times daily. 10 tablet 0    BP 161/91  Pulse 71  Temp 98 F (36.7 C) (Oral)  Resp 18  SpO2 98%  Physical Exam CONSTITUTIONAL: Well developed/well nourished HEAD AND FACE: Normocephalic/atraumatic EYES: EOMI/PERRL ENMT: Mucous membranes moist NECK: supple no meningeal signs SPINE:entire spine nontender CV: S1/S2 noted, no murmurs/rubs/gallops noted LUNGS: Lungs are clear to auscultation bilaterally, no apparent distress ABDOMEN: soft, nontender, no rebound or guarding GU:no cva tenderness NEURO: Pt is awake/alert, moves all extremitiesx4 No ataxia.  No arm/leg drift.  No facial droop. EXTREMITIES: pulses normal, full ROM SKIN: warm, color normal PSYCH: no abnormalities of mood noted  ED Course  Procedures   Pt well appearing, no distress.  Only mild fatigue/dizziness reported, no vertigo.  I doubt hypertensive emergency She was admitted recently and had meds adjusted and now back on hydralazine.  Advised to continue meds and call PCP in two days  1. HTN (hypertension)       MDM  Nursing notes including past medical history and social history reviewed and considered in documentation Previous records reviewed and considered        Date: 03/10/2012  Rate: 71  Rhythm: normal sinus rhythm  QRS Axis: left  Intervals: normal  ST/T Wave abnormalities: normal  Conduction Disutrbances:none  Narrative Interpretation:   Old EKG Reviewed: unchanged    Joya Gaskins, MD 03/10/12 2102

## 2012-03-10 NOTE — ED Notes (Signed)
Pt states her BP was elevated at home today. Pt denies pain but states she "Doesnt feel well." C/o lightheadedness, denies LOC or vision trouble. Pt states she started a new BP medication on thursday

## 2012-03-14 ENCOUNTER — Encounter: Payer: Self-pay | Admitting: Family Medicine

## 2012-03-14 ENCOUNTER — Ambulatory Visit (INDEPENDENT_AMBULATORY_CARE_PROVIDER_SITE_OTHER): Payer: BC Managed Care – PPO | Admitting: Family Medicine

## 2012-03-14 VITALS — BP 146/95 | HR 85 | Temp 98.4°F | Resp 18 | Ht 60.5 in | Wt 155.0 lb

## 2012-03-14 DIAGNOSIS — E559 Vitamin D deficiency, unspecified: Secondary | ICD-10-CM

## 2012-03-14 DIAGNOSIS — I10 Essential (primary) hypertension: Secondary | ICD-10-CM

## 2012-03-14 LAB — BASIC METABOLIC PANEL
CO2: 27 mEq/L (ref 19–32)
Chloride: 103 mEq/L (ref 96–112)
Creat: 0.82 mg/dL (ref 0.50–1.10)

## 2012-03-14 NOTE — Patient Instructions (Addendum)
Hypertension As your heart beats, it forces blood through your arteries. This force is your blood pressure. If the pressure is too high, it is called hypertension (HTN) or high blood pressure. HTN is dangerous because you may have it and not know it. High blood pressure may mean that your heart has to work harder to pump blood. Your arteries may be narrow or stiff. The extra work puts you at risk for heart disease, stroke, and other problems.  Blood pressure consists of two numbers, a higher number over a lower, 110/72, for example. It is stated as "110 over 72." The ideal is below 120 for the top number (systolic) and under 80 for the bottom (diastolic). Write down your blood pressure today. You should pay close attention to your blood pressure if you have certain conditions such as:  Heart failure.  Prior heart attack.  Diabetes  Chronic kidney disease.  Prior stroke.  Multiple risk factors for heart disease. To see if you have HTN, your blood pressure should be measured while you are seated with your arm held at the level of the heart. It should be measured at least twice. A one-time elevated blood pressure reading (especially in the Emergency Department) does not mean that you need treatment. There may be conditions in which the blood pressure is different between your right and left arms. It is important to see your caregiver soon for a recheck. Most people have essential hypertension which means that there is not a specific cause. This type of high blood pressure may be lowered by changing lifestyle factors such as:  Stress.  Smoking.  Lack of exercise.  Excessive weight.  Drug/tobacco/alcohol use.  Eating less salt. Most people do not have symptoms from high blood pressure until it has caused damage to the body. Effective treatment can often prevent, delay or reduce that damage. TREATMENT  When a cause has been identified, treatment for high blood pressure is directed at the  cause. There are a large number of medications to treat HTN. These fall into several categories, and your caregiver will help you select the medicines that are best for you. Medications may have side effects. You should review side effects with your caregiver. If your blood pressure stays high after you have made lifestyle changes or started on medicines,   Your medication(s) may need to be changed.  Other problems may need to be addressed.  Be certain you understand your prescriptions, and know how and when to take your medicine.  Be sure to follow up with your caregiver within the time frame advised (usually within two weeks) to have your blood pressure rechecked and to review your medications.  If you are taking more than one medicine to lower your blood pressure, make sure you know how and at what times they should be taken. Taking two medicines at the same time can result in blood pressure that is too low. SEEK IMMEDIATE MEDICAL CARE IF:  You develop a severe headache, blurred or changing vision, or confusion.  You have unusual weakness or numbness, or a faint feeling.  You have severe chest or abdominal pain, vomiting, or breathing problems. MAKE SURE YOU:   Understand these instructions.  Will watch your condition.  Will get help right away if you are not doing well or get worse. Document Released: 04/25/2005 Document Revised: 07/18/2011 Document Reviewed: 12/14/2007 [employer] Patient Information 2013 [employer]. Vitamin D Deficiency Vitamin D is an important vitamin that your body needs. Having too little   of it in your body is called a deficiency. A very bad deficiency can make your bones soft and can cause a condition called rickets.  Vitamin D is important to your body for different reasons, such as:   It helps your body absorb 2 minerals called calcium and phosphorus.  It helps make your bones healthy.  It may prevent some diseases, such as diabetes and multiple  sclerosis.  It helps your muscles and heart. You can get vitamin D in several ways. It is a natural part of some foods. The vitamin is also added to some dairy products and cereals. Some people take vitamin D supplements. Also, your body makes vitamin D when you are in the sun. It changes the sun's rays into a form of the vitamin that your body can use. CAUSES   Not eating enough foods that contain vitamin D.  Not getting enough sunlight.  Having certain digestive system diseases that make it hard to absorb vitamin D. These diseases include Crohn's disease, chronic pancreatitis, and cystic fibrosis.  Having a surgery in which part of the stomach or small intestine is removed.  Being obese. Fat cells pull vitamin D out of your blood. That means that obese people may not have enough vitamin D left in their blood and in other body tissues.  Having chronic kidney or liver disease. RISK FACTORS Risk factors are things that make you more likely to develop a vitamin D deficiency. They include:  Being older.  Not being able to get outside very much.  Living in a nursing home.  Having had broken bones.  Having weak or thin bones (osteoporosis).  Having a disease or condition that changes how your body absorbs vitamin D.  Having dark skin.  Some medicines such as seizure medicines or steroids.  Being overweight or obese. SYMPTOMS Mild cases of vitamin D deficiency may not have any symptoms. If you have a very bad case, symptoms may include:  Bone pain.  Muscle pain.  Falling often.  Broken bones caused by a minor injury, due to osteoporosis. DIAGNOSIS A blood test is the best way to tell if you have a vitamin D deficiency. TREATMENT Vitamin D deficiency can be treated in different ways. Treatment for vitamin D deficiency depends on what is causing it. Options include:  Taking vitamin D supplements.  Taking a calcium supplement. Your caregiver will suggest what dose is best  for you. HOME CARE INSTRUCTIONS  Take any supplements that your caregiver prescribes. Follow the directions carefully. Take only the suggested amount.  Have your blood tested 2 months after you start taking supplements.  Eat foods that contain vitamin D. Healthy choices include:  Fortified dairy products, cereals, or juices. Fortified means vitamin D has been added to the food. Check the label on the package to be sure.  Fatty fish like salmon or trout.  Eggs.  Oysters.  Spend some time in the sun. Most people should get out in the sun without sunblock for 10 to 15 minutes at a time. Do this 3 times a week. People who have had skin cancer should not do this. Ask your caregiver how long you should be in the sun. Do not use a tanning bed.  Keep your weight at a healthy level. Lose weight if you need to.  Keep all follow-up appointments. Your caregiver will need to perform blood tests to make sure your vitamin D deficiency is going away. SEEK MEDICAL CARE IF:  You have any questions   about your treatment.  You continue to have symptoms of vitamin D deficiency.  You have nausea or vomiting.  You are constipated.  You feel confused.  You have severe abdominal or back pain. MAKE SURE YOU:  Understand these instructions.  Will watch your condition.  Will get help right away if you are not doing well or get worse. Document Released: 07/18/2011 Document Reviewed: 07/18/2011 [employer] Patient Information 2013 [employer].  

## 2012-03-14 NOTE — Progress Notes (Signed)
S: This 61 y.o. AA female is a new pt who has HTN treated in past by Dr. Renaye Rakers. She had been taking Losartan-HCTZ  100-25 mg and doing well. Dr. Parke Simmers had given pt some samples of Tribenzor but response to that medication is unclear.  She had a syncopal episode on 03/05/12 and was admitted to the hospital for evaluation of hypotension. She was on Azithromycin for treatment of CAP at the time of admission. Cardiac work-up was unremarkable. At discharge, she was prescribed Hydralazine 10 mg tid; she is not comfortable with all the medication changes and is still feeling fatigued and not fully recovered. She last took Losartan_HCTZ yesterday at 8 AM.  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    ROS: Negative for fever/chills, CP or tightness, palpitations, SOB or cough, numbness, HA, weakness or syncope.  O: Filed Vitals:   03/14/12 1136  BP: 146/95  Pulse: 85  Temp: 98.4 F (36.9 C)  Resp: 18   GEN: In NAD; WN,WD. HEENT: [place]/AT; EOMI w/ clear conj/scl  EACs/TMs normal; post ph normal. NECK: Supple w/o LAN or TMG> LUNGS: CTA w/o rhonchi or wheezes. COR: RRR. NEURO: A&O x 3; CNs intact. Nonfocal.   A/P: 1. HTN, goal below 130/80  TSH, T4, Free, Vitamin D,  Basic metabolic panel Resume Losartan-HCTZ 100- 25 mg  1 tab daily  2. Vitamin D deficiency  Vitamin D, 25-hydroxy Get OTC Vit D3   2000 IU and take it daily

## 2012-03-15 ENCOUNTER — Other Ambulatory Visit: Payer: Self-pay | Admitting: Family Medicine

## 2012-03-15 LAB — VITAMIN D 25 HYDROXY (VIT D DEFICIENCY, FRACTURES): Vit D, 25-Hydroxy: 12 ng/mL — ABNORMAL LOW (ref 30–89)

## 2012-03-15 MED ORDER — ERGOCALCIFEROL 1.25 MG (50000 UT) PO CAPS: 50000.0000 [IU] | ORAL_CAPSULE | ORAL | Status: DC

## 2012-03-15 NOTE — Progress Notes (Signed)
Quick Note:  Please call pt and advise that the following labs are abnormal... Vitamin D is very low; need to take prescription-strength Vit D 50,000 Units 1 capsule once a week for several months. This has been routed to pt's pharmacy. Will recheck level in ~ 12 weeks. Try to get 10-15 minutes of sun exposure most days of the week.  Thyroid tests are normal. Chemistries and kidney function results are normal.  Copy to pt. ______

## 2012-03-20 ENCOUNTER — Ambulatory Visit (INDEPENDENT_AMBULATORY_CARE_PROVIDER_SITE_OTHER): Payer: BC Managed Care – PPO | Admitting: Family Medicine

## 2012-03-20 ENCOUNTER — Encounter: Payer: Self-pay | Admitting: Family Medicine

## 2012-03-20 VITALS — BP 120/70 | HR 84 | Temp 98.1°F | Resp 16 | Ht 59.5 in | Wt 154.4 lb

## 2012-03-20 DIAGNOSIS — R109 Unspecified abdominal pain: Secondary | ICD-10-CM

## 2012-03-20 DIAGNOSIS — R5383 Other fatigue: Secondary | ICD-10-CM

## 2012-03-20 DIAGNOSIS — R5381 Other malaise: Secondary | ICD-10-CM

## 2012-03-20 DIAGNOSIS — R634 Abnormal weight loss: Secondary | ICD-10-CM

## 2012-03-20 LAB — POCT CBC
Granulocyte percent: 48 %G (ref 37–80)
HCT, POC: 45.6 % (ref 37.7–47.9)
Hemoglobin: 14.1 g/dL (ref 12.2–16.2)
Lymph, poc: 2.4 (ref 0.6–3.4)
MCH, POC: 29.5 pg (ref 27–31.2)
MCHC: 30.9 g/dL — AB (ref 31.8–35.4)
MCV: 95.4 fL (ref 80–97)
MID (cbc): 0.4 (ref 0–0.9)
MPV: 8.9 fL (ref 0–99.8)
POC Granulocyte: 2.5 (ref 2–6.9)
POC LYMPH PERCENT: 45 %L (ref 10–50)
POC MID %: 7 %M (ref 0–12)
Platelet Count, POC: 402 10*3/uL (ref 142–424)
RBC: 4.78 M/uL (ref 4.04–5.48)
RDW, POC: 12.4 %
WBC: 5.3 10*3/uL (ref 4.6–10.2)

## 2012-03-20 LAB — COMPREHENSIVE METABOLIC PANEL
ALT: 31 U/L (ref 0–35)
AST: 20 U/L (ref 0–37)
Albumin: 4.9 g/dL (ref 3.5–5.2)
Alkaline Phosphatase: 74 U/L (ref 39–117)
BUN: 15 mg/dL (ref 6–23)
CO2: 29 mEq/L (ref 19–32)
Calcium: 10.1 mg/dL (ref 8.4–10.5)
Chloride: 97 mEq/L (ref 96–112)
Creat: 0.88 mg/dL (ref 0.50–1.10)
Glucose, Bld: 97 mg/dL (ref 70–99)
Potassium: 4.3 mEq/L (ref 3.5–5.3)
Sodium: 137 mEq/L (ref 135–145)
Total Bilirubin: 0.5 mg/dL (ref 0.3–1.2)
Total Protein: 7.6 g/dL (ref 6.0–8.3)

## 2012-03-20 LAB — POCT URINALYSIS DIPSTICK
Bilirubin, UA: NEGATIVE
Blood, UA: NEGATIVE
Glucose, UA: NEGATIVE
Ketones, UA: NEGATIVE
Nitrite, UA: NEGATIVE
Protein, UA: NEGATIVE
Spec Grav, UA: 1.015
Urobilinogen, UA: 0.2
pH, UA: 6

## 2012-03-20 LAB — POCT UA - MICROSCOPIC ONLY
Casts, Ur, LPF, POC: NEGATIVE
Crystals, Ur, HPF, POC: NEGATIVE
Mucus, UA: NEGATIVE
Yeast, UA: NEGATIVE

## 2012-03-20 LAB — POCT SEDIMENTATION RATE: POCT SED RATE: 29 mm/hr — AB (ref 0–22)

## 2012-03-20 LAB — IFOBT (OCCULT BLOOD): IFOBT: NEGATIVE

## 2012-03-20 NOTE — Progress Notes (Signed)
61 yo woman with pneumonia diagnosed a couple weeks ago with CT scan.  She was started on antibiotics, then went to ED where she was admitted overnight.  She has continued to be exhausted and has never had a cough.  She also denies shortness of breath or chest pain.  Her appetite is down, but she is forcing herself to eat. Some mild headaches.  She takes aspirin x 1 month. No vaginal bleeding, dysuria, constipation.  Objective:  NAD, appears anxious though HEENT (including fundi):  Normal Neck: supple, no adenopathy Chest:  Clear Heart:  Regular without murmur or gallop Abdomen:  Soft, tender left side, no masses Skin: no rashes Results for orders placed in visit on 03/20/12  IFOBT (OCCULT BLOOD)      Component Value Range   IFOBT Negative    POCT CBC      Component Value Range   WBC 5.3  4.6 - 10.2 K/uL   Lymph, poc 2.4  0.6 - 3.4   POC LYMPH PERCENT 45.0  10 - 50 %L   MID (cbc) 0.4  0 - 0.9   POC MID % 7.0  0 - 12 %M   POC Granulocyte 2.5  2 - 6.9   Granulocyte percent 48.0  37 - 80 %G   RBC 4.78  4.04 - 5.48 M/uL   Hemoglobin 14.1  12.2 - 16.2 g/dL   HCT, POC 82.9  56.2 - 47.9 %   MCV 95.4  80 - 97 fL   MCH, POC 29.5  27 - 31.2 pg   MCHC 30.9 (*) 31.8 - 35.4 g/dL   RDW, POC 13.0     Platelet Count, POC 402  142 - 424 K/uL   MPV 8.9  0 - 99.8 fL  POCT URINALYSIS DIPSTICK      Component Value Range   Color, UA yellow     Clarity, UA clear     Glucose, UA neg     Bilirubin, UA neg     Ketones, UA neg     Spec Grav, UA 1.015     Blood, UA neg     pH, UA 6.0     Protein, UA neg     Urobilinogen, UA 0.2     Nitrite, UA neg     Leukocytes, UA Trace    POCT UA - MICROSCOPIC ONLY      Component Value Range   WBC, Ur, HPF, POC 0-4     RBC, urine, microscopic 0-1     Bacteria, U Microscopic trace     Mucus, UA neg     Epithelial cells, urine per micros 0-2     Crystals, Ur, HPF, POC neg     Casts, Ur, LPF, POC neg     Yeast, UA neg      Assessment:  Persistent  fatigue and weight loss, unexplained  Plan:  CT of abdomen and pelvis 1. Weight loss  IFOBT POC (occult bld, rslt in office), POCT CBC, POCT urinalysis dipstick, POCT UA - Microscopic Only, Comprehensive metabolic panel, Protein electrophoresis, serum, POCT SEDIMENTATION RATE  2. Fatigue  IFOBT POC (occult bld, rslt in office), POCT CBC, POCT urinalysis dipstick, POCT UA - Microscopic Only, Comprehensive metabolic panel, Protein electrophoresis, serum, POCT SEDIMENTATION RATE

## 2012-03-21 ENCOUNTER — Other Ambulatory Visit: Payer: Self-pay | Admitting: Family Medicine

## 2012-03-21 DIAGNOSIS — K5792 Diverticulitis of intestine, part unspecified, without perforation or abscess without bleeding: Secondary | ICD-10-CM

## 2012-03-21 MED ORDER — CIPROFLOXACIN HCL 500 MG PO TABS: 500.0000 mg | ORAL_TABLET | Freq: Two times a day (BID) | ORAL | Status: DC

## 2012-03-21 MED ORDER — METRONIDAZOLE 500 MG PO TABS: 500.0000 mg | ORAL_TABLET | Freq: Two times a day (BID) | ORAL | Status: DC

## 2012-03-22 LAB — PROTEIN ELECTROPHORESIS, SERUM
Albumin ELP: 57 % (ref 55.8–66.1)
Alpha-1-Globulin: 6.6 % — ABNORMAL HIGH (ref 2.9–4.9)
Alpha-2-Globulin: 9.9 % (ref 7.1–11.8)
Beta 2: 4.6 % (ref 3.2–6.5)
Beta Globulin: 5.9 % (ref 4.7–7.2)
Gamma Globulin: 16 % (ref 11.1–18.8)
Total Protein, Serum Electrophoresis: 7.6 g/dL (ref 6.0–8.3)

## 2012-03-23 ENCOUNTER — Encounter: Payer: Self-pay | Admitting: Family Medicine

## 2012-03-23 ENCOUNTER — Ambulatory Visit (INDEPENDENT_AMBULATORY_CARE_PROVIDER_SITE_OTHER): Payer: BC Managed Care – PPO | Admitting: Family Medicine

## 2012-03-23 VITALS — BP 140/88 | HR 84 | Temp 98.4°F | Resp 20 | Ht 61.0 in | Wt 152.2 lb

## 2012-03-23 DIAGNOSIS — F411 Generalized anxiety disorder: Secondary | ICD-10-CM

## 2012-03-23 DIAGNOSIS — K5732 Diverticulitis of large intestine without perforation or abscess without bleeding: Secondary | ICD-10-CM

## 2012-03-23 DIAGNOSIS — R5381 Other malaise: Secondary | ICD-10-CM

## 2012-03-23 DIAGNOSIS — F5105 Insomnia due to other mental disorder: Secondary | ICD-10-CM

## 2012-03-23 DIAGNOSIS — R630 Anorexia: Secondary | ICD-10-CM

## 2012-03-23 DIAGNOSIS — F489 Nonpsychotic mental disorder, unspecified: Secondary | ICD-10-CM

## 2012-03-23 DIAGNOSIS — R5383 Other fatigue: Secondary | ICD-10-CM

## 2012-03-23 DIAGNOSIS — F419 Anxiety disorder, unspecified: Secondary | ICD-10-CM

## 2012-03-23 MED ORDER — HYDROXYZINE HCL 10 MG PO TABS: ORAL_TABLET | ORAL | Status: DC

## 2012-03-23 NOTE — Patient Instructions (Signed)
We have discussed ways for you to improve your intake so that you have enough calories on a daily basis. Try to eat easy-to-digest foods every 3 or so hours. The medication that I have ordered today will help calm your nerves so you can eat and rest better. I will see you next Wednesday at your 11:30 appt. If you feel worse or develop fever/ chills, vomiting or cannot keep your medications down, go to the Emergency Dept.

## 2012-03-23 NOTE — Progress Notes (Signed)
  Subjective:    Patient ID: Victoria Hogan, female    DOB: 09/24/1950, 62 y.o.   MRN: [id number]  HPI   This 61 y.o female was recently diagnosed with "intestinal infection" for which she is taking  Metronidazole and Cipro. She states she has been ill for over a month and feels like she has some  problem that no one has found and she is really distressed about her lack of improvement. She feels  tired and weak, is not sleeping well and has poor appetite. She reports 15 -pound weight loss since  the start of this illness. Reports feelinf like "something is crawling" under her skin and it is an awful   sensation. She is only sleeping 2-3 hours at a time; she lives alone. She ate 1 meal yesterday w/o  n/v/d or abd pain. Drinking anything bedsides water seems to increase pain.    Review of Systems  Constitutional: Positive for activity change, appetite change and fatigue. Negative for fever and chills.  Respiratory: Negative for cough, chest tightness and shortness of breath.   Cardiovascular: Positive for palpitations. Negative for chest pain.  Gastrointestinal: Positive for nausea and abdominal pain. Negative for vomiting, diarrhea, constipation, blood in stool and abdominal distention.  Musculoskeletal: Negative.   Skin: Negative.   Neurological: Positive for weakness.  Psychiatric/Behavioral: Positive for sleep disturbance and dysphoric mood. The patient is nervous/anxious.        Objective:   Physical Exam  Nursing note and vitals reviewed. Constitutional: She is oriented to person, place, and time. She appears well-developed and well-nourished. No distress.  HENT:  Head: Normocephalic and atraumatic.  Right Ear: External ear normal.  Left Ear: External ear normal.  Nose: Nose normal.  Mouth/Throat: Oropharynx is clear and moist. No oropharyngeal exudate.  Eyes: Conjunctivae normal and EOM are normal. Pupils are equal, round, and reactive to light. No scleral icterus.  Neck:  Normal range of motion. Neck supple. No thyromegaly present.  Cardiovascular: Normal rate, regular rhythm and normal heart sounds.  Exam reveals no gallop and no friction rub.   No murmur heard. Pulmonary/Chest: Effort normal and breath sounds normal. No respiratory distress.  Abdominal: Soft. She exhibits no distension and no mass. There is tenderness. There is no rebound and no guarding.       LLQ tenderness w/o mass  Musculoskeletal: Normal range of motion.  Lymphadenopathy:    She has no cervical adenopathy.  Neurological: She is alert and oriented to person, place, and time. She has normal reflexes. No cranial nerve deficit.  Skin: Skin is warm and dry. No rash noted. No pallor.  Psychiatric: Her behavior is normal. Thought content normal.       Affect is flat; pt is tearful and somewhat despondent.    Reviewed all recent labs with pt.  CT Abd and Pelvis with contrast (03/21/12)- performed at [facility]/ [facility]: Left colonic diverticulosis and mild Acute diverticulitis in distal descending colon. Moderate constipation.      Assessment & Plan:   1. Fatigue       Multifactorial- poor appetite and lack of sleep.  2. Anorexia symptom    Encouraged pt to eat frequent small meals and maintain good hydration  3. Insomnia secondary to anxiety    RX: Hydroxyzine 10 mg   Take as directed.  4. Diverticulitis large intestine     Continue current antibiotics   Take w/ food unless directed otherwise.

## 2012-03-28 ENCOUNTER — Ambulatory Visit (INDEPENDENT_AMBULATORY_CARE_PROVIDER_SITE_OTHER): Payer: BC Managed Care – PPO | Admitting: Family Medicine

## 2012-03-28 ENCOUNTER — Encounter: Payer: Self-pay | Admitting: Family Medicine

## 2012-03-28 VITALS — BP 110/78 | HR 80 | Temp 99.1°F | Resp 16 | Ht 59.5 in | Wt 153.8 lb

## 2012-03-28 DIAGNOSIS — K5732 Diverticulitis of large intestine without perforation or abscess without bleeding: Secondary | ICD-10-CM

## 2012-03-28 MED ORDER — LOSARTAN POTASSIUM-HCTZ 100-25 MG PO TABS: ORAL_TABLET | ORAL | Status: DC

## 2012-03-28 MED ORDER — FAMOTIDINE 20 MG PO TABS: ORAL_TABLET | ORAL | Status: DC

## 2012-03-28 NOTE — Patient Instructions (Addendum)
Continue taking Losartan- HCTZ  1/2 tablet daily. Your blood pressure is well controlled. Take 1 Baby Aspirin 81 mg daily. Continue to be careful about what you are eating.  Diverticulitis Small pockets or "bubbles" can develop in the wall of the intestine. Diverticulitis is when those pockets become infected and inflamed. This causes stomach pain (usually on the left side). HOME CARE  Take all medicine as told by your doctor.  Try a clear liquid diet (broth, tea, or water) for as long as told by your doctor.  Keep all follow-up visits with your doctor.  You may be put on a low-fiber diet once you start feeling better. Here are foods that have low-fiber:  White breads, cereals, rice, and pasta.  Cooked fruits and vegetables or soft fresh fruits and vegetables without the skin.  Ground or well-cooked tender beef, ham, veal, lamb, pork, or poultry.  Eggs and seafood.  After you are doing well on the low-fiber diet, you may be put on a high-fiber diet. Here are ways to increase your fiber:  Choose whole-grain breads, cereals, pasta, and brown rice.  Choose fruits and vegetables with skin on. Do not overcook the vegetables.  Choose nuts, seeds, legumes, dried peas, beans, and lentils.  Look for food products that have more than 3 grams of fiber per serving on the food label. GET HELP RIGHT AWAY IF:  Your pain does not get better or gets worse.  You have trouble eating food.  You are not pooping (having bowel movements) like normal.  You have a temperature by mouth above 102 F (38.9 C), not controlled by medicine.  You keep throwing up (vomiting).  You have bloody or black, tarry poop (stools).  You are getting worse and not better. MAKE SURE YOU:   Understand these instructions.  Will watch your condition.  Will get help right away if you are not doing well or get worse. Document Released: 10/12/2007 Document Revised: 07/18/2011 Document Reviewed:  03/16/2009 [facility] Patient Information 2013 [place], [place].

## 2012-03-28 NOTE — Progress Notes (Signed)
S:  This 61 y.o. female returns for follow-up re: GI infection and fatigue with anxiety last week. She feels much better with improved appetite and sleep. She takes the Hydroxyzine at bedtime. Her stools are soft, absent abdominal pain and fever  or nausea. She has almost completed antibiotics; denies any medication side effects.  O:  Filed Vitals:   03/28/12 1159  BP: 110/78  Pulse: 80  Temp: 99.1 F (37.3 C)  Resp: 16   GEN: In NAD; WN,WD. HENT: /AT; EOMI w/ clear conj and sclerae. Otherwise unremarkable. COR: RRR. LUNGS: Normal resp rate and effort. ABD: Soft, nondistended; + LLQ tenderness w/o rebound or guarding. No masses. NEURO: A&O x 3; CNs intact. Nonfocal.  A/P: 1. Diverticulitis large intestine       Finish antibiotics; eat smart, mindful of offending foods. Continue current medications anad RTC in 4 weeks.

## 2012-03-30 ENCOUNTER — Encounter: Payer: Self-pay | Admitting: Family Medicine

## 2012-03-31 ENCOUNTER — Encounter: Payer: Self-pay | Admitting: Family Medicine

## 2012-04-02 ENCOUNTER — Encounter: Payer: Self-pay | Admitting: Family Medicine

## 2012-04-08 ENCOUNTER — Ambulatory Visit (INDEPENDENT_AMBULATORY_CARE_PROVIDER_SITE_OTHER): Payer: BC Managed Care – PPO | Admitting: Family Medicine

## 2012-04-08 VITALS — BP 135/82 | HR 72 | Temp 98.4°F | Resp 16 | Ht 59.5 in | Wt 153.0 lb

## 2012-04-08 DIAGNOSIS — R110 Nausea: Secondary | ICD-10-CM

## 2012-04-08 DIAGNOSIS — K625 Hemorrhage of anus and rectum: Secondary | ICD-10-CM

## 2012-04-08 DIAGNOSIS — I10 Essential (primary) hypertension: Secondary | ICD-10-CM

## 2012-04-08 LAB — POCT CBC
Granulocyte percent: 66 %G (ref 37–80)
HCT, POC: 43.1 % (ref 37.7–47.9)
Hemoglobin: 13.3 g/dL (ref 12.2–16.2)
Lymph, poc: 1.9 (ref 0.6–3.4)
MCH, POC: 29.6 pg (ref 27–31.2)
MCHC: 30.9 g/dL — AB (ref 31.8–35.4)
MCV: 95.8 fL (ref 80–97)
MID (cbc): 0.3 (ref 0–0.9)
MPV: 8.8 fL (ref 0–99.8)
POC Granulocyte: 4.2 (ref 2–6.9)
POC LYMPH PERCENT: 29.6 %L (ref 10–50)
POC MID %: 4.4 %M (ref 0–12)
Platelet Count, POC: 366 10*3/uL (ref 142–424)
RBC: 4.5 M/uL (ref 4.04–5.48)
RDW, POC: 13.7 %
WBC: 6.3 10*3/uL (ref 4.6–10.2)

## 2012-04-08 LAB — IFOBT (OCCULT BLOOD): IFOBT: NEGATIVE

## 2012-04-08 MED ORDER — ONDANSETRON 4 MG PO TBDP: 4.0000 mg | ORAL_TABLET | Freq: Three times a day (TID) | ORAL | Status: DC | PRN

## 2012-04-08 NOTE — Progress Notes (Signed)
@[email or web address] @  Patient ID: CARMINE ESHLEMAN MRN: [id number], DOB: Oct 12, 1950, 61 y.o. Date of Encounter: 04/08/2012, 1:24 PM  Primary Physician: Geraldo Pitter, MD  Chief Complaint: HTN  HPI: 61 y.o. year old female with history below presents with continued intermittent bleeding and left sided abdominal pain for over a month.  She had a CT which showed diverticulitis and was treated with antibiotics which were completed 10 days ago.   She passed grossly bloody stool this morning and feels weak and somewhat faint.    Past Medical History  Diagnosis Date  . Hypertension   . Pneumonia 02/2012  . GERD (gastroesophageal reflux disease)   . History of colonic diverticulitis      Home Meds: Prior to Admission medications   Medication Sig Start Date End Date Taking? Authorizing Provider  aspirin EC 325 MG tablet Take 1 tablet (325 mg total) by mouth daily. 02/23/12  Yes Hurman Horn, MD  ergocalciferol (DRISDOL) 50000 UNITS capsule Take 1 capsule (50,000 Units total) by mouth once a week. 03/15/12 03/15/13 Yes Maurice March, MD  famotidine (PEPCID) 20 MG tablet Take 1 tablet twice a day as needed for stomach upset. 03/28/12  Yes Maurice March, MD  losartan-hydrochlorothiazide (HYZAAR) 100-25 MG per tablet Take 1/2 tablet by mouth daily or a directed. 03/28/12  Yes Maurice March, MD  acetaminophen (TYLENOL) 500 MG tablet Take 500 mg by mouth every 6 (six) hours as needed. headache    Historical Provider, MD  ciprofloxacin (CIPRO) 500 MG tablet Take 1 tablet (500 mg total) by mouth 2 (two) times daily. 03/21/12   Elvina Sidle, MD  hydrOXYzine (ATARAX/VISTARIL) 10 MG tablet Take 1 tablet in AM, 1 tablet at midday and 1 or 2 tablets for sleep. 03/23/12   Maurice March, MD    Allergies: No Known Allergies  History   Social History  . Marital Status: Divorced    Spouse Name: N/A    Number of Children: N/A  . Years of Education: N/A   Occupational History  . Not on  file.   Social History Main Topics  . Smoking status: Never Smoker   . Smokeless tobacco: Never Used  . Alcohol Use: No     Comment: occa  . Drug Use: No  . Sexually Active: Not on file   Other Topics Concern  . Not on file   Social History Narrative  . No narrative on file     Family History  Problem Relation Age of Onset  . Heart failure Father     Review of Systems: Constitutional: negative for chills, fever, night sweats, weight changes HEENT: negative for vision changes, hearing loss, congestion, rhinorrhea, ST, epistaxis, or sinus pressure Cardiovascular: negative for chest pain, palpitations, or DOE Respiratory: negative for hemoptysis, wheezing, shortness of breath, or cough Abdominal: negative for vomiting Dermatological: negative for rash Neurologic: negative for headache, dizziness, or syncope All other systems reviewed and are otherwise negative with the exception to those above and in the HPI.   Physical Exam: Blood pressure 136/94, pulse 74, temperature 98.4 F (36.9 C), temperature source Oral, resp. rate 16, height 4' 11.5" (1.511 m), weight 153 lb (69.4 kg), SpO2 98.00%., Body mass index is 30.38 kg/(m^2). General: Well developed, well nourished, in no acute distress. Head: Normocephalic, atraumatic, eyes without discharge, sclera non-icteric, nares are without discharge. Bilateral auditory canals clear, TM's are without perforation, pearly grey and translucent with reflective cone of light bilaterally. Oral cavity moist, posterior pharynx without  exudate, erythema, peritonsillar abscess, or post nasal drip.  Neck: Supple. No thyromegaly. Full ROM. No lymphadenopathy. No carotid bruits. Lungs: Clear bilaterally to auscultation without wheezes, rales, or rhonchi. Breathing is unlabored. Heart: RRR with S1 S2. Systolic murmur appreciated best at right sternal border.  Abdomen: Soft, non-distended with normoactive bowel sounds. No hepatosplenomegaly. No  rebound/guarding. No obvious abdominal masses.  She is tender in the LLQ. Msk:  Strength and tone normal for age. Extremities/Skin: Warm and dry. No clubbing or cyanosis. No edema. No rashes or suspicious lesions. Distal pulses 2+ and equal bilaterally. Neuro: Alert and oriented X 3. Moves all extremities spontaneously. Gait is normal. CNII-XII grossly in tact. DTR 2+, cerebellar function intact. Rhomberg normal. Psych:  Responds to questions appropriately with a normal affect.  Rectal:  No hemorrhoids, nontender, no blood on glove, no rectal masses. Results for orders placed in visit on 03/20/12  IFOBT (OCCULT BLOOD)      Component Value Range   IFOBT Negative    POCT CBC      Component Value Range   WBC 5.3  4.6 - 10.2 K/uL   Lymph, poc 2.4  0.6 - 3.4   POC LYMPH PERCENT 45.0  10 - 50 %L   MID (cbc) 0.4  0 - 0.9   POC MID % 7.0  0 - 12 %M   POC Granulocyte 2.5  2 - 6.9   Granulocyte percent 48.0  37 - 80 %G   RBC 4.78  4.04 - 5.48 M/uL   Hemoglobin 14.1  12.2 - 16.2 g/dL   HCT, POC 40.9  81.1 - 47.9 %   MCV 95.4  80 - 97 fL   MCH, POC 29.5  27 - 31.2 pg   MCHC 30.9 (*) 31.8 - 35.4 g/dL   RDW, POC 91.4     Platelet Count, POC 402  142 - 424 K/uL   MPV 8.9  0 - 99.8 fL  POCT URINALYSIS DIPSTICK      Component Value Range   Color, UA yellow     Clarity, UA clear     Glucose, UA neg     Bilirubin, UA neg     Ketones, UA neg     Spec Grav, UA 1.015     Blood, UA neg     pH, UA 6.0     Protein, UA neg     Urobilinogen, UA 0.2     Nitrite, UA neg     Leukocytes, UA Trace    POCT UA - MICROSCOPIC ONLY      Component Value Range   WBC, Ur, HPF, POC 0-4     RBC, urine, microscopic 0-1     Bacteria, U Microscopic trace     Mucus, UA neg     Epithelial cells, urine per micros 0-2     Crystals, Ur, HPF, POC neg     Casts, Ur, LPF, POC neg     Yeast, UA neg    COMPREHENSIVE METABOLIC PANEL      Component Value Range   Sodium 137  135 - 145 mEq/L   Potassium 4.3  3.5 - 5.3  mEq/L   Chloride 97  96 - 112 mEq/L   CO2 29  19 - 32 mEq/L   Glucose, Bld 97  70 - 99 mg/dL   BUN 15  6 - 23 mg/dL   Creat 7.82  9.56 - 2.13 mg/dL   Total Bilirubin 0.5  0.3 - 1.2 mg/dL  Alkaline Phosphatase 74  39 - 117 U/L   AST 20  0 - 37 U/L   ALT 31  0 - 35 U/L   Total Protein 7.6  6.0 - 8.3 g/dL   Albumin 4.9  3.5 - 5.2 g/dL   Calcium 16.1  8.4 - 09.6 mg/dL  PROTEIN ELECTROPHORESIS, SERUM      Component Value Range   Total Protein, serum electrophor 7.6  6.0 - 8.3 g/dL   Albumin ELP 04.5  40.9 - 66.1 %   Alpha-1-Globulin 6.6 (*) 2.9 - 4.9 %   Alpha-2-Globulin 9.9  7.1 - 11.8 %   Beta Globulin 5.9  4.7 - 7.2 %   Beta 2 4.6  3.2 - 6.5 %   Gamma Globulin 16.0  11.1 - 18.8 %   M-Spike, % NOT DET     SPE Interp.       COMMENT (PROTEIN ELECTROPHOR)      POCT SEDIMENTATION RATE      Component Value Range   POCT SED RATE 29 (*) 0 - 22 mm/hr    ASSESSMENT AND PLAN: 61 year old woman with reported hematochezia, fatigue, and left-sided abdominal pain for over a month. She's also clearly very depressed and discouraged with the very to improve. Nevertheless I don't have objective reasons to admit her at this point so I will try to discuss this with Dr. Loreta Ave in the morning. In the meantime I prescribed some Zofran for nausea and try to be positive about eventually getting a diagnosis and moving on. 61 y.o. year old female with diverticular bleed, heavy this morning, associated with weakness. -  Signed, Elvina Sidle, MD 04/08/2012 1:24 PM

## 2012-04-08 NOTE — Patient Instructions (Signed)
Call or go directly to the emergency department for fever, increasing pain, further rectal bleeding, of passing out.

## 2012-04-10 ENCOUNTER — Encounter: Payer: Self-pay | Admitting: Family Medicine

## 2012-04-11 ENCOUNTER — Ambulatory Visit (INDEPENDENT_AMBULATORY_CARE_PROVIDER_SITE_OTHER): Payer: BC Managed Care – PPO | Admitting: Family Medicine

## 2012-04-11 VITALS — BP 126/80 | HR 80 | Temp 98.6°F | Resp 16 | Ht 59.5 in | Wt 143.0 lb

## 2012-04-11 DIAGNOSIS — R5381 Other malaise: Secondary | ICD-10-CM

## 2012-04-11 DIAGNOSIS — K5792 Diverticulitis of intestine, part unspecified, without perforation or abscess without bleeding: Secondary | ICD-10-CM

## 2012-04-11 DIAGNOSIS — K5732 Diverticulitis of large intestine without perforation or abscess without bleeding: Secondary | ICD-10-CM

## 2012-04-11 DIAGNOSIS — R5383 Other fatigue: Secondary | ICD-10-CM

## 2012-04-11 DIAGNOSIS — R110 Nausea: Secondary | ICD-10-CM

## 2012-04-11 DIAGNOSIS — R6883 Chills (without fever): Secondary | ICD-10-CM

## 2012-04-11 NOTE — Progress Notes (Signed)
[facility]:  Office Visit  Chief Complaint:  Chief Complaint  Patient presents with  . Diverticulitis    was here sunday and not better; saw Dr. Elnoria Howard on 04/09/12 and was given additional antibiotics    HPI: Victoria Hogan is a 61 y.o. female who complains of chills, "heat wave all through body", headaches and diarrhea. She has had some significant health setbacks in the last 2 months. Since the end of October 03/02/12 she has been battling  Pneumonia then she had diverticulitis. Her last episode of bright red blood in stool was Sunday, 3 days ago. She was on Azithromycin x 7 days for her CAP and completed that  03/08/12 followed by 2 rounds of abx cipro 500 mg BID and Flagyl 500 mg TID x 10 days for her diverticulitis, first prescribed by Dr. Milus Glazier and now prescribed by Dr. Elnoria Howard. She was hospitalized for presyncope/syncope at the end of October 2013 after getting dx with PNA  for orthostatic hypotension most likely related to her hypertensive meds. She is no longer on HTN medication for this reason , plus she does not need due to a "20 lb" weight loss . She came in to see Dr. Milus Glazier after getting treated for diverticulitis and was not feeling better so was referred to Dr. Elnoria Howard for a colonoscopy. CT scan of  abdomen and pelvis was also ordered and showed + diverticulitis . This is her second round of abx whiich again is prescribed by Dr. Elnoria Howard.  He is planning for her to finish the 2 week course of abx before doing her colonoscopy in roughly 6 weeks. She continues to complain of not feeling better, just worse. Continues to  have nonbloody diarrhea, every time she eats.   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 Admits to having depressive thoughts, cannot sleep and concentrate; used to love going to work, worries that her illnesses are impairing her function at work. She doe snot know why she feels so  bad and wants to know that she will get better.   Past Medical History  Diagnosis Date  . Hypertension   . Pneumonia 02/2012  . GERD (gastroesophageal reflux disease)   . History of colonic diverticulitis    Past Surgical History  Procedure Date  . Bunyonectomy   . Abdominal hysterectomy    History   Social History  . Marital Status: Divorced    Spouse Name: N/A    Number of Children: N/A  . Years of Education: N/A   Social History Main Topics  . Smoking status: Never Smoker   . Smokeless tobacco: Never Used  . Alcohol Use: No     Comment: occa  . Drug Use: No  . Sexually Active: None   Other Topics Concern  . None   Social History Narrative  . None   Family History  Problem Relation Age of Onset  . Heart failure Father    No Known Allergies Prior to Admission medications   Medication Sig Start Date End Date Taking? Authorizing Provider  acetaminophen (TYLENOL) 500 MG tablet Take 500 mg by mouth every 6 (six) hours as needed. headache   Yes Historical Provider, MD  cholecalciferol (VITAMIN D) 1000 UNITS tablet Take 1,000 Units by mouth daily.   Yes Historical Provider, MD  ciprofloxacin (CIPRO) 500 MG tablet Take 1 tablet (500 mg total) by mouth 2 (two) times  daily. 03/21/12  Yes Elvina Sidle, MD  ergocalciferol (DRISDOL) 50000 UNITS capsule Take 1 capsule (50,000 Units total) by mouth once a week. 03/15/12 03/15/13 Yes Maurice March, MD  famotidine (PEPCID) 20 MG tablet Take 1 tablet twice a day as needed for stomach upset. 03/28/12  Yes Maurice March, MD  hydrOXYzine (ATARAX/VISTARIL) 10 MG tablet Take 1 tablet in AM, 1 tablet at midday and 1 or 2 tablets for sleep. 03/23/12  Yes Maurice March, MD  losartan-hydrochlorothiazide (HYZAAR) 100-25 MG per tablet Take 1/2 tablet by mouth daily or a directed. 03/28/12  Yes Maurice March, MD  metroNIDAZOLE (FLAGYL) 500 MG tablet Take 500 mg by mouth 3 (three) times daily.   Yes Historical Provider,  MD  ondansetron (ZOFRAN ODT) 4 MG disintegrating tablet Take 1 tablet (4 mg total) by mouth every 8 (eight) hours as needed for nausea. 04/08/12  Yes Elvina Sidle, MD     ROS: The patient deniesunintentional weight loss, chest pain, palpitations, wheezing, dyspnea on exertion,vomiting, dysuria, hematuria, melena, numbness,or tingling.  All other systems have been reviewed and were otherwise negative with the exception of those mentioned in the HPI and as above.    PHYSICAL EXAM: Filed Vitals:   04/11/12 1113  BP: 126/80  Pulse: 80  Temp: 98.6 F (37 C)  Resp: 16   Filed Vitals:   04/11/12 1113  Height: 4' 11.5" (1.511 m)  Weight: 143 lb (64.864 kg)   Body mass index is 28.40 kg/(m^2).  General: Alert, anxious and moderate  Distress, tearful HEENT:  Normocephalic, atraumatic, oropharynx patent.  Cardiovascular:  Regular rate and rhythm, no rubs murmurs or gallops.  No Carotid bruits, radial pulse intact. No pedal edema.  Respiratory: Clear to auscultation bilaterally.  No wheezes, rales, or rhonchi.  No cyanosis, no use of accessory musculature GI: No organomegaly, abdomen is soft and mild left lower quadrant abdominal tenderness, positive bowel sounds.  No masses. Skin: No rashes. Neurologic: Facial musculature symmetric. Psychiatric: Patient is appropriate throughout our interaction. Tearful, worried Lymphatic: No cervical lymphadenopathy Musculoskeletal: Gait intact.   LABS: Results for orders placed in visit on 04/08/12  POCT CBC      Component Value Range   WBC 6.3  4.6 - 10.2 K/uL   Lymph, poc 1.9  0.6 - 3.4   POC LYMPH PERCENT 29.6  10 - 50 %L   MID (cbc) 0.3  0 - 0.9   POC MID % 4.4  0 - 12 %M   POC Granulocyte 4.2  2 - 6.9   Granulocyte percent 66.0  37 - 80 %G   RBC 4.50  4.04 - 5.48 M/uL   Hemoglobin 13.3  12.2 - 16.2 g/dL   HCT, POC 16.1  09.6 - 47.9 %   MCV 95.8  80 - 97 fL   MCH, POC 29.6  27 - 31.2 pg   MCHC 30.9 (*) 31.8 - 35.4 g/dL   RDW, POC  04.5     Platelet Count, POC 366  142 - 424 K/uL   MPV 8.8  0 - 99.8 fL  IFOBT (OCCULT BLOOD)      Component Value Range   IFOBT Negative       EKG/XRAY:   Primary read interpreted by Dr. Conley Rolls at [facility]   ASSESSMENT/PLAN: Encounter Diagnoses  Name Primary?  . Diverticulitis Yes  . Nausea   . Chills   . Fatigue    Worsening sxs of fatigue since on another round of Cipro  and Flagyl for diverticulitis, colonoscopy pending in roughly 6 weeks [facility] recommended 2x per week, patietn will try to get into this free program since has depressive sxs Recommend [facility] wort since not interested in antidepresants and I do not believe that there are SEs that will impact her current sxs. She has depressive sxs, if have SI/HI and plan then go to ER, call 911 prn Will give patient note for 1 month off since she cannot function at work, she is a high school Retail buyer Continue with Cipro and Flagyl rx by Dr. Elnoria Howard, f/u with Dr. Elnoria Howard in 2 weeks after Cipro and Flagyl meds completed BRAT diet and diverticultis diet F/u prn, will call her in 2 days to see how she is doing Recommend zofran 15-30 min prior to eating, tylenol for chills at night, thermometer for subjective fevers so we can have objective findings.       Keigan Tafoya PHUONG, DO 04/11/2012 12:10 PM

## 2012-04-25 ENCOUNTER — Ambulatory Visit: Payer: BC Managed Care – PPO | Admitting: Family Medicine

## 2012-05-04 ENCOUNTER — Ambulatory Visit (INDEPENDENT_AMBULATORY_CARE_PROVIDER_SITE_OTHER): Payer: BC Managed Care – PPO | Admitting: Family Medicine

## 2012-05-04 ENCOUNTER — Encounter: Payer: Self-pay | Admitting: Family Medicine

## 2012-05-04 VITALS — BP 132/86 | HR 79 | Temp 98.4°F | Resp 16 | Ht 60.0 in | Wt 146.0 lb

## 2012-05-04 DIAGNOSIS — I10 Essential (primary) hypertension: Secondary | ICD-10-CM

## 2012-05-04 DIAGNOSIS — Z760 Encounter for issue of repeat prescription: Secondary | ICD-10-CM

## 2012-05-04 DIAGNOSIS — Z029 Encounter for administrative examinations, unspecified: Secondary | ICD-10-CM

## 2012-05-04 MED ORDER — HYDROXYZINE HCL 10 MG PO TABS: ORAL_TABLET | ORAL | Status: DC

## 2012-05-04 NOTE — Patient Instructions (Signed)
Gluten- sensitivity has been found to be an inflammatory component in the bowel. You may want to ask Dr. Elnoria Howard about eliminating gluten from your diet.  Your blood pressure looks good off medication.

## 2012-05-04 NOTE — Progress Notes (Signed)
S: This 61 y.o  female has continued to have episodic GI problems. She has seen Dr. Elnoria Howard and is scheduled for colonoscopy 05/29/12. Dr. Elnoria Howard prescribed Nexium for GERD but it is too expensive to pay for out-of-pocket; she takes TUMS instead. She has completed another round of antibiotics and does feel better. She has gradually increased activity and diet. Stools have returned to normal. She is still having a quivering sensation in her mid-section. She takes Hydroxyzine only at bedtime for sleep.   Re: BP (off medication) - denies CP or tightness, palpitations, SOB or DOE, edema, HA, dizziness, weakness or syncope.  O:  Filed Vitals:   05/04/12 1147  BP: 132/86  Pulse: 79  Temp: 98.4 F (36.9 C)  Resp: 16   GEN: In NAD: WN,WD. Appears somewhat anxious. HENT: [place]/AT. EOMI w/ clearconj/ sclerae. EACs normal. Oroph moist and clear. NECK: Supple w/o LAN or TMG. COR: RRR. No edema. LUNGS: CTA; normal resp rate and effort. BACK: No CVAT. ABD: Normal appearance; no distention, no tenderness, masses or HSM. NEURO: A&O x 3; CNs intact. Nonfocal,  A/P:  1. HTN, goal below 140/90   Stable off medication; continue to monitor.  2. Administrative encounter   From related to FMLA/ disability extended. Pt given letter w/ return to work date- 05/14/2012.  3. Medication refill     Refill Hydroxyzine.

## 2012-05-08 ENCOUNTER — Encounter: Payer: Self-pay | Admitting: Family Medicine

## 2012-05-09 DIAGNOSIS — F419 Anxiety disorder, unspecified: Secondary | ICD-10-CM | POA: Insufficient documentation

## 2012-06-05 ENCOUNTER — Other Ambulatory Visit: Payer: Self-pay | Admitting: Family Medicine

## 2012-06-07 ENCOUNTER — Other Ambulatory Visit: Payer: Self-pay | Admitting: Family Medicine

## 2012-06-30 ENCOUNTER — Other Ambulatory Visit: Payer: Self-pay | Admitting: Physician Assistant

## 2012-07-28 ENCOUNTER — Other Ambulatory Visit: Payer: Self-pay | Admitting: Physician Assistant

## 2012-07-30 ENCOUNTER — Encounter: Payer: Self-pay | Admitting: Family Medicine

## 2012-08-03 ENCOUNTER — Ambulatory Visit (INDEPENDENT_AMBULATORY_CARE_PROVIDER_SITE_OTHER): Payer: BC Managed Care – PPO | Admitting: Family Medicine

## 2012-08-03 ENCOUNTER — Encounter: Payer: Self-pay | Admitting: Family Medicine

## 2012-08-03 VITALS — BP 116/82 | HR 65 | Temp 97.5°F | Resp 16 | Ht 60.0 in | Wt 157.2 lb

## 2012-08-03 DIAGNOSIS — R635 Abnormal weight gain: Secondary | ICD-10-CM

## 2012-08-03 NOTE — Patient Instructions (Addendum)
Your BP is normal but I am concerned about it being too low; so you can add a little salt and maintain good hydration. Try to get back to increased activity and I will see you in 4 months.

## 2012-08-06 NOTE — Progress Notes (Signed)
S:  This 62 y.o. female returns for HTN f/u; she reports daily compliance w/ medications w/o side effects. She is concerned about weight gain but admits that appetite is good and winter weather has hampered exercise.  ROS negative for diaphoresis, fatigue, CP or tightness, palpitations, SOB or cough, edema, dizziness, lightheadedness, weakness or syncope. She feels well and chronic GI issues are in resolution. She has improved nutrition and eliminated food items that are problematic.  O:  Filed Vitals:   08/03/12 0800  BP: 116/82  Pulse: 65  Temp: 97.5 F (36.4 C)  Resp: 16   GEN: In NAD; WN, WD. HENT: [place]/AT; EOMI w/ clear conj/ sclerae. EAC/ nose normal. Oroph clear and moist. COR: RRR. No edema. LUNGS: Normal resp rate and effort. SKIN: W&D. No pallor. NEURO: A&O x 3; CNs intact. Nonfocal.  A/P: Weight gain- 14-pound weight gain since early Dec 2013. Pt is eating healthier and feels well enough to start walking again when weather permits. Her goal weight= 150 lbs.

## 2012-08-25 ENCOUNTER — Other Ambulatory Visit: Payer: Self-pay | Admitting: Family Medicine

## 2012-08-29 ENCOUNTER — Other Ambulatory Visit: Payer: Self-pay | Admitting: Physician Assistant

## 2012-10-01 ENCOUNTER — Other Ambulatory Visit: Payer: Self-pay | Admitting: Physician Assistant

## 2012-10-02 MED ORDER — VITAMIN D (ERGOCALCIFEROL) 1.25 MG (50000 UNIT) PO CAPS: 50000.0000 [IU] | ORAL_CAPSULE | ORAL | Status: DC

## 2012-10-02 MED ORDER — HYDROXYZINE HCL 10 MG PO TABS: ORAL_TABLET | ORAL | Status: DC

## 2012-12-05 ENCOUNTER — Encounter: Payer: BC Managed Care – PPO | Admitting: Family Medicine

## 2012-12-05 ENCOUNTER — Ambulatory Visit (INDEPENDENT_AMBULATORY_CARE_PROVIDER_SITE_OTHER): Payer: BC Managed Care – PPO | Admitting: Family Medicine

## 2012-12-05 ENCOUNTER — Encounter: Payer: Self-pay | Admitting: Family Medicine

## 2012-12-05 VITALS — BP 140/88 | HR 69 | Temp 97.9°F | Resp 16 | Ht 59.5 in | Wt 161.0 lb

## 2012-12-05 DIAGNOSIS — E559 Vitamin D deficiency, unspecified: Secondary | ICD-10-CM

## 2012-12-05 DIAGNOSIS — Z1231 Encounter for screening mammogram for malignant neoplasm of breast: Secondary | ICD-10-CM

## 2012-12-05 DIAGNOSIS — F411 Generalized anxiety disorder: Secondary | ICD-10-CM

## 2012-12-05 DIAGNOSIS — Z0000 Encounter for general adult medical examination without abnormal findings: Secondary | ICD-10-CM

## 2012-12-05 DIAGNOSIS — Z23 Encounter for immunization: Secondary | ICD-10-CM

## 2012-12-05 DIAGNOSIS — Z13220 Encounter for screening for lipoid disorders: Secondary | ICD-10-CM

## 2012-12-05 DIAGNOSIS — I10 Essential (primary) hypertension: Secondary | ICD-10-CM

## 2012-12-05 LAB — POCT URINALYSIS DIPSTICK
Bilirubin, UA: NEGATIVE
Glucose, UA: NEGATIVE
Ketones, UA: NEGATIVE
Leukocytes, UA: NEGATIVE
Protein, UA: NEGATIVE
Spec Grav, UA: 1.02

## 2012-12-05 LAB — BASIC METABOLIC PANEL
CO2: 29 mEq/L (ref 19–32)
Chloride: 98 mEq/L (ref 96–112)
Potassium: 3.8 mEq/L (ref 3.5–5.3)
Sodium: 137 mEq/L (ref 135–145)

## 2012-12-05 LAB — IFOBT (OCCULT BLOOD): IFOBT: NEGATIVE

## 2012-12-05 LAB — LIPID PANEL
Cholesterol: 234 mg/dL — ABNORMAL HIGH (ref 0–200)
HDL: 65 mg/dL (ref >39)
LDL Cholesterol: 150 mg/dL — ABNORMAL HIGH (ref 0–99)
Total CHOL/HDL Ratio: 3.6 Ratio
Triglycerides: 95 mg/dL (ref <150)
VLDL: 19 mg/dL (ref 0–40)

## 2012-12-05 MED ORDER — LOSARTAN POTASSIUM-HCTZ 100-25 MG PO TABS: 1.0000 | ORAL_TABLET | Freq: Every day | ORAL | Status: DC

## 2012-12-05 MED ORDER — HYDROXYZINE HCL 10 MG PO TABS: ORAL_TABLET | ORAL | Status: DC

## 2012-12-05 NOTE — Progress Notes (Signed)
Subjective:    Patient ID: Victoria Hogan, female    DOB: 11-Mar-1951, 62 y.o.   MRN: [id number]  HPI  This 62 y.o. Female is here for CPE; she is s/p TAH > 2 decades ago (fibroids). Pt has HTN  and chronic mild anxiety. Current medications are effective w/o adverse effects. Pt needs MMG  and will schedule vision evaluation w/ an optometrist.   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   HCM: CRS- Early 2014 by Dr. Elnoria Howard.            IMM: Needs Tdap and Zostavax.  Patient Active Problem List   Diagnosis Date Noted  . Hypertension 04/08/2012  . Diverticulitis large intestine 03/23/2012  . HTN, goal below 130/80 03/14/2012  . Vitamin D deficiency 03/14/2012  . Heart murmur 03/06/2012     Review of Systems  Constitutional: Positive for appetite change.  HENT: Negative.   Eyes: Negative.   Respiratory: Negative.   Cardiovascular: Negative.   Gastrointestinal: Positive for diarrhea.  Endocrine: Negative.   Genitourinary: Negative.   Musculoskeletal: Positive for myalgias.  Skin: Negative.   Allergic/Immunologic: Negative.   Neurological: Negative.   Hematological: Negative.   Psychiatric/Behavioral: Positive for behavioral problems. The patient is nervous/anxious.        Objective:   Physical Exam  Nursing note and vitals reviewed. Constitutional: She is oriented to person, place, and time. Vital signs are normal. She appears well-developed and well-nourished. No distress.  HENT:  Head: Normocephalic and atraumatic.  Right Ear: Hearing, tympanic membrane, external ear and ear canal normal.  Left Ear: Hearing, tympanic membrane, external ear and ear canal normal.  Nose: Nose normal. No nasal deformity or septal deviation.  Mouth/Throat: Uvula is midline, oropharynx is clear and moist and mucous membranes are normal. No oral lesions. No dental caries.  Missing tooth/teeth.  Eyes: Conjunctivae, EOM and lids are normal. Pupils are equal, round, and reactive to light. No  scleral icterus.  Fundoscopic exam:      The right eye shows arteriolar narrowing. The right eye shows no hemorrhage and no papilledema. The right eye shows red reflex.       The left eye shows arteriolar narrowing. The left eye shows no hemorrhage and no papilledema. The left eye shows red reflex.  Neck: Normal range of motion. Neck supple. No JVD present. No spinous process tenderness and no muscular tenderness present. Carotid bruit is not present. Normal range of motion present. No thyromegaly present.  Cardiovascular: Normal rate, regular rhythm, S1 normal, S2 normal, normal heart sounds and intact distal pulses.  PMI is not displaced.  Exam reveals no gallop and no friction rub.   No murmur heard. Pulmonary/Chest: Effort normal and breath sounds normal. No respiratory distress. Right breast exhibits no inverted nipple, no mass, no nipple discharge, no skin change and no tenderness. Left breast exhibits no inverted nipple, no mass, no nipple discharge, no skin change and no tenderness. Breasts are symmetrical.  Abdominal: Soft. Normal appearance and bowel sounds are normal. She exhibits no distension, no abdominal bruit, no pulsatile midline mass and no mass. There is no hepatosplenomegaly. There is no tenderness. There is no guarding and no CVA tenderness. No hernia.  Genitourinary: Rectum normal and vagina normal. Rectal exam shows no external hemorrhoid, no mass, no tenderness and anal tone normal. Guaiac negative stool. Right adnexum displays no mass and no tenderness. Left adnexum displays no mass and no tenderness.  NEFG; no PAP performed.  Musculoskeletal: Normal range  of motion. She exhibits no edema and no tenderness.  Lymphadenopathy:    She has no cervical adenopathy.  Neurological: She is alert and oriented to person, place, and time. She has normal strength and normal reflexes. She displays no atrophy. No cranial nerve deficit or sensory deficit. She exhibits normal muscle tone.  Coordination and gait normal.  Skin: Skin is warm and dry. No bruising, no ecchymosis and no rash noted. She is not diaphoretic. No cyanosis or erythema. No pallor. Nails show no clubbing.  Psychiatric: Her speech is normal and behavior is normal. Judgment and thought content normal. Her mood appears anxious. Her affect is not angry, not blunt, not labile and not inappropriate. She does not exhibit a depressed mood.    Results for orders placed in visit on 12/05/12  POCT URINALYSIS DIPSTICK      Result Value Range   Color, UA yellow     Clarity, UA clear     Glucose, UA neg     Bilirubin, UA neg     Ketones, UA neg     Spec Grav, UA 1.020     Blood, UA neg     pH, UA 5.5     Protein, UA neg     Urobilinogen, UA 0.2     Nitrite, UA neg     Leukocytes, UA Negative    IFOBT (OCCULT BLOOD)      Result Value Range   IFOBT Negative         Assessment & Plan:  Routine general medical examination at a health care facility - Plan: POCT urinalysis dipstick, IFOBT POC (occult bld, rslt in office)  HTN (hypertension) - Stable and controlled; continue current medication.   Plan: Basic metabolic panel  Unspecified vitamin D deficiency - Plan: Vitamin D, 25-hydroxy  Anxiety state, unspecified- Continue prn use of Alprazolam.  Screening for hyperlipidemia - Plan: Lipid panel  Other screening mammogram - Plan: MM Digital Screening  Need for prophylactic vaccination with combined diphtheria-tetanus-pertussis (DTP) vaccine - Plan: Tdap vaccine greater than or equal to 7yo IM Pt given RX for Zostavax.  Meds ordered this encounter  Medications  . DISCONTD: losartan-hydrochlorothiazide (HYZAAR) 100-25 MG per tablet    Sig: Take 1 tablet by mouth daily.  Marland Kitchen losartan-hydrochlorothiazide (HYZAAR) 100-25 MG per tablet    Sig: Take 1 tablet by mouth daily.    Dispense:  30 tablet    Refill:  11  . hydrOXYzine (ATARAX/VISTARIL) 10 MG tablet    Sig: TAKE 1 TABLET BY MOUTH EVERY MORNING 1 AT  MIDDAY AND 1 OR 2 FOR SLEEP AS DIRECTED    Dispense:  60 tablet    Refill:  3

## 2012-12-05 NOTE — Patient Instructions (Signed)
Keeping You Healthy  Get These Tests  Blood Pressure- Have your blood pressure checked by your healthcare provider at least once a year.  Normal blood pressure is 120/80.  Weight- Have your body mass index (BMI) calculated to screen for obesity.  BMI is a measure of body fat based on height and weight.  You can calculate your own BMI at https://www.west-esparza.com/  Cholesterol- Have your cholesterol checked every year.  Diabetes- Have your blood sugar checked every year if you have high blood pressure, high cholesterol, a family history of diabetes or if you are overweight.  Pap Smear- Have a pap smear every 1 to 3 years if you have been sexually active.  If you are older than 65 and recent pap smears have been normal you may not need additional pap smears.  In addition, if you have had a hysterectomy  For benign disease additional pap smears are not necessary.  Mammogram-Yearly mammograms are essential for early detection of breast cancer  Screening for Colon Cancer- Colonoscopy starting at age 55. Screening may begin sooner depending on your family history and other health conditions.  Follow up colonoscopy as directed by your Gastroenterologist.  Screening for Osteoporosis- Screening begins at age 89 with bone density scanning, sooner if you are at higher risk for developing Osteoporosis.  Get these medicines  Calcium with Vitamin D- Your body requires 1200-1500 mg of Calcium a day and [phone] IU of Vitamin D a day.  You can only absorb 500 mg of Calcium at a time therefore Calcium must be taken in 2 or 3 separate doses throughout the day.  Hormones- Hormone therapy has been associated with increased risk for certain cancers and heart disease.  Talk to your healthcare provider about if you need relief from menopausal symptoms.  Aspirin- Ask your healthcare provider about taking Aspirin to prevent Heart Disease and Stroke.  Get these Immuniztions  Flu shot- Every fall  Pneumonia  shot- Once after the age of 65; if you are younger ask your healthcare provider if you need a pneumonia shot.  Tetanus- Every ten years. Today, you received a Tdap. Next Tetanus is due in 2024.  Zostavax- Once after the age of 48 to prevent shingles. You have the prescription for this vaccine; get it in 3-4 weeks at [facility] or [employer] at [facility].  Take these steps  Don't smoke- Your healthcare provider can help you quit. For tips on how to quit, ask your healthcare provider or go to www.smokefree.gov or call 1-800 QUIT-NOW.  Be physically active- Exercise 5 days a week for a minimum of 30 minutes.  If you are not already physically active, start slow and gradually work up to 30 minutes of moderate physical activity.  Try walking, dancing, bike riding, swimming, etc.  Eat a healthy diet- Eat a variety of healthy foods such as fruits, vegetables, whole grains, low fat milk, low fat cheeses, yogurt, lean meats, chicken, fish, eggs, dried beans, tofu, etc.  For more information go to www.thenutritionsource.org  Dental visit- Brush and floss teeth twice daily; visit your dentist twice a year.  Eye exam- Visit your Optometrist or Ophthalmologist yearly.  Drink alcohol in moderation- Limit alcohol intake to one drink or less a day.  Never drink and drive.  Depression- Your emotional health is as important as your physical health.  If you're feeling down or losing interest in things you normally enjoy, please talk to your healthcare provider.  Seat Belts- can save your  life; always wear one  Smoke/Carbon Monoxide detectors- These detectors need to be installed on the appropriate level of your home.  Replace batteries at least once a year.  Violence- If anyone is threatening or hurting you, please tell your healthcare provider.  Living Will/ Health care power of attorney- Discuss with your healthcare provider and family.    Anxiety and Panic Attacks Anxiety is  your body's way of reacting to real danger or something you think is a danger. It may be fear or worry over a situation like losing your job. Sometimes the cause is not known. A panic attack is made up of physical signs like sweating, shaking, or chest pain. Anxiety and panic attacks may start suddenly. They may be strong. They may come at any time of day, even while sleeping. They may come at any time of life. Panic attacks are scary, but they do not harm you physically.  HOME CARE  Avoid any known causes of your anxiety.  Try to relax. Yoga may help. Tell yourself everything will be okay.  Exercise often.  Get expert advice and help (therapy) to stop anxiety or attacks from happening.  Avoid caffeine, alcohol, and drugs.  Only take medicine as told by your doctor. GET HELP RIGHT AWAY IF:  Your attacks seem different than normal attacks.  Your problems are getting worse or concern you. MAKE SURE YOU:  Understand these instructions.  Will watch your condition.  Will get help right away if you are not doing well or get worse. Document Released: 05/28/2010 Document Revised: 07/18/2011 Document Reviewed: 05/28/2010 [facility] Patient Information 2014 [place], [place].

## 2012-12-07 ENCOUNTER — Encounter: Payer: Self-pay | Admitting: Family Medicine

## 2012-12-07 ENCOUNTER — Other Ambulatory Visit: Payer: Self-pay | Admitting: Family Medicine

## 2013-03-14 ENCOUNTER — Other Ambulatory Visit: Payer: Self-pay

## 2013-04-10 ENCOUNTER — Ambulatory Visit (INDEPENDENT_AMBULATORY_CARE_PROVIDER_SITE_OTHER): Payer: BC Managed Care – PPO | Admitting: Family Medicine

## 2013-04-10 ENCOUNTER — Encounter: Payer: Self-pay | Admitting: Family Medicine

## 2013-04-10 VITALS — BP 151/85 | HR 79 | Temp 97.8°F | Resp 16 | Ht 60.5 in | Wt 165.0 lb

## 2013-04-10 DIAGNOSIS — Z23 Encounter for immunization: Secondary | ICD-10-CM

## 2013-04-10 DIAGNOSIS — K5900 Constipation, unspecified: Secondary | ICD-10-CM

## 2013-04-10 DIAGNOSIS — K5732 Diverticulitis of large intestine without perforation or abscess without bleeding: Secondary | ICD-10-CM

## 2013-04-10 DIAGNOSIS — K625 Hemorrhage of anus and rectum: Secondary | ICD-10-CM

## 2013-04-10 LAB — IFOBT (OCCULT BLOOD): IFOBT: POSITIVE

## 2013-04-10 MED ORDER — METRONIDAZOLE 500 MG PO TABS: 500.0000 mg | ORAL_TABLET | Freq: Three times a day (TID) | ORAL | Status: DC

## 2013-04-10 MED ORDER — CIPROFLOXACIN HCL 500 MG PO TABS: 500.0000 mg | ORAL_TABLET | Freq: Two times a day (BID) | ORAL | Status: DC

## 2013-04-10 NOTE — Patient Instructions (Signed)
Diverticulitis °A diverticulum is a small pouch or sac on the colon. Diverticulosis is the presence of these diverticula on the colon. Diverticulitis is the irritation (inflammation) or infection of diverticula. °CAUSES  °The colon and its diverticula contain bacteria. If food particles block the tiny opening to a diverticulum, the bacteria inside can grow and cause an increase in pressure. This leads to infection and inflammation and is called diverticulitis. °SYMPTOMS  °· Abdominal pain and tenderness. Usually, the pain is located on the left side of your abdomen. However, it could be located elsewhere. °· Fever. °· Bloating. °· Feeling sick to your stomach (nausea). °· Throwing up (vomiting). °· Abnormal stools. °DIAGNOSIS  °Your caregiver will take a history and perform a physical exam. Since many things can cause abdominal pain, other tests may be necessary. Tests may include: °· Blood tests. °· Urine tests. °· X-ray of the abdomen. °· CT scan of the abdomen. °Sometimes, surgery is needed to determine if diverticulitis or other conditions are causing your symptoms. °TREATMENT  °Most of the time, you can be treated without surgery. Treatment includes: °· Resting the bowels by only having liquids for a few days. As you improve, you will need to eat a low-fiber diet. °· Intravenous (IV) fluids if you are losing body fluids (dehydrated). °· Antibiotic medicines that treat infections may be given. °· Pain and nausea medicine, if needed. °· Surgery if the inflamed diverticulum has burst. °HOME CARE INSTRUCTIONS  °· Try a clear liquid diet (broth, tea, or water for as long as directed by your caregiver). You may then gradually begin a low-fiber diet as tolerated.  °A low-fiber diet is a diet with less than 10 grams of fiber. Choose the foods below to reduce fiber in the diet: °· White breads, cereals, rice, and pasta. °· Cooked fruits and vegetables or soft fresh fruits and vegetables without the skin. °· Ground or  well-cooked tender beef, ham, veal, lamb, pork, or poultry. °· Eggs and seafood. °· After your diverticulitis symptoms have improved, your caregiver may put you on a high-fiber diet. A high-fiber diet includes 14 grams of fiber for every 1000 calories consumed. For a standard 2000 calorie diet, you would need 28 grams of fiber. Follow these diet guidelines to help you increase the fiber in your diet. It is important to slowly increase the amount fiber in your diet to avoid gas, constipation, and bloating. °· Choose whole-grain breads, cereals, pasta, and brown rice. °· Choose fresh fruits and vegetables with the skin on. Do not overcook vegetables because the more vegetables are cooked, the more fiber is lost. °· Choose more nuts, seeds, legumes, dried peas, beans, and lentils. °· Look for food products that have greater than 3 grams of fiber per serving on the Nutrition Facts label. °· Take all medicine as directed by your caregiver. °· If your caregiver has given you a follow-up appointment, it is very important that you go. Not going could result in lasting (chronic) or permanent injury, pain, and disability. If there is any problem keeping the appointment, call to reschedule. °SEEK MEDICAL CARE IF:  °· Your pain does not improve. °· You have a hard time advancing your diet beyond clear liquids. °· Your bowel movements do not return to normal. °SEEK IMMEDIATE MEDICAL CARE IF:  °· Your pain becomes worse. °· You have an oral temperature above 102° F (38.9° C), not controlled by medicine. °· You have repeated vomiting. °· You have bloody or black, tarry stools. °·   Symptoms that brought you to your caregiver become worse or are not getting better. °MAKE SURE YOU:  °· Understand these instructions. °· Will watch your condition. °· Will get help right away if you are not doing well or get worse. °Document Released: 02/02/2005 Document Revised: 07/18/2011 Document Reviewed: 05/31/2010 °[employer]® Patient Information  ©2014 [employer]. ° °

## 2013-04-11 NOTE — Progress Notes (Signed)
S:  This 62 y.o. AA female presents w/ abd pain since Thanksgiving Day, 6 days ago. She felt well on that day but began to develop lower abd discomfort and change in stools 2-3 days ago w/ constipation and blood in stools. Pt acknowledges hemorrhoids. She denies fever/ chills, n/v/d or abd distention. She has increased gas. Diet modifications have helped; she has resumed some of the diet changes advised last year when she found out she had diverticular disease. Pt does not think she ingested any foods that caused this problem.  Colonoscopy was performed in Jan 2014.  Patient Active Problem List   Diagnosis Date Noted  . Hypertension 04/08/2012  . Diverticulitis large intestine 03/23/2012  . HTN, goal below 130/80 03/14/2012  . Vitamin D deficiency 03/14/2012  . Heart murmur 03/06/2012   PMHx, Soc Hx and Fam Hx reviewed.  Medications reconciled.  ROS: AS per HPI.  O: Filed Vitals:   04/10/13 0819  BP: 151/85  Pulse: 79  Temp: 97.8 F (36.6 C)  Resp: 16   GEN: In NAD: WN,WD. HEENT: [place]/AT; EOMI w/ clear conj/sclerae. EACs/canals/TMs normal. Oroph clear and moist w/o lesions. NECK: Supple w/o LAN or TMG. COR: RRR. LUNGS: CTA; normal resp rate and effort. BACK: No CVAT. ABD: Increased BS; mild tenderness in LLQ and RLQ- no rebound. No masses or HSM. GU: Ext hemorrhoid w/ anal fissure and small int hemorrhoid palpable.  Hemosure +. SKIN: W&D; intact w/o diaphoresis, erythema or rashes. NEURO: A&O x 3; CNs intact. Nonfocal.  A/P: Rectal bleeding - Plan: IFOBT POC (occult bld, rslt in office)  Unspecified constipation- Continue dietary modifications.  Diverticulitis large intestine - Plan: RXs for Cipro and Metronidazole as below.  Need for prophylactic vaccination and inoculation against influenza - Plan: Flu Vaccine QUAD 36+ mos IM  Meds ordered this encounter  Medications  . ciprofloxacin (CIPRO) 500 MG tablet    Sig: Take 1 tablet (500 mg total) by mouth 2 (two) times daily.     Dispense:  20 tablet    Refill:  0  . metroNIDAZOLE (FLAGYL) 500 MG tablet    Sig: Take 1 tablet (500 mg total) by mouth 3 (three) times daily.    Dispense:  30 tablet    Refill:  0

## 2013-04-12 ENCOUNTER — Encounter: Payer: Self-pay | Admitting: Family Medicine

## 2013-04-12 DIAGNOSIS — R110 Nausea: Secondary | ICD-10-CM

## 2013-04-15 MED ORDER — ONDANSETRON 4 MG PO TBDP: 4.0000 mg | ORAL_TABLET | Freq: Three times a day (TID) | ORAL | Status: DC | PRN

## 2013-04-15 NOTE — Telephone Encounter (Signed)
Sent zofran to pharmacy to hopefully reduce NV associated with cipro, flagyl

## 2013-04-25 ENCOUNTER — Ambulatory Visit: Payer: BC Managed Care – PPO | Admitting: Family Medicine

## 2013-12-30 ENCOUNTER — Other Ambulatory Visit: Payer: Self-pay | Admitting: Family Medicine

## 2014-01-27 ENCOUNTER — Ambulatory Visit (INDEPENDENT_AMBULATORY_CARE_PROVIDER_SITE_OTHER): Payer: BC Managed Care – PPO

## 2014-01-27 ENCOUNTER — Ambulatory Visit (INDEPENDENT_AMBULATORY_CARE_PROVIDER_SITE_OTHER): Payer: BC Managed Care – PPO | Admitting: Family Medicine

## 2014-01-27 VITALS — BP 158/98 | HR 83 | Temp 98.2°F | Resp 18 | Ht 60.0 in | Wt 164.8 lb

## 2014-01-27 DIAGNOSIS — R042 Hemoptysis: Secondary | ICD-10-CM

## 2014-01-27 DIAGNOSIS — R05 Cough: Secondary | ICD-10-CM

## 2014-01-27 DIAGNOSIS — R059 Cough, unspecified: Secondary | ICD-10-CM

## 2014-01-27 MED ORDER — HYDROCODONE-HOMATROPINE 5-1.5 MG/5ML PO SYRP: 5.0000 mL | ORAL_SOLUTION | Freq: Three times a day (TID) | ORAL | Status: DC | PRN

## 2014-01-27 MED ORDER — AZITHROMYCIN 250 MG PO TABS: ORAL_TABLET | ORAL | Status: DC

## 2014-01-27 NOTE — Patient Instructions (Signed)
Use the azithromycin as directed for your cough and bronchitis. Use the cough syrup as needed but remember it will make you sleepy. If you do not feel better or if you continue to cough up blood tinged mucus please let me know .

## 2014-01-27 NOTE — Progress Notes (Signed)
[facility] [address] [place] [address] [phone]  Date:  01/27/2014   Name:  Victoria Hogan   DOB:  07/04/1950   MRN:  [id number]  PCP:  No PCP Per Patient    Chief Complaint: URI   History of Present Illness:  Victoria Hogan is a 63 y.o. very pleasant female patient who presents with the following:  She is here today with illness.  She has noted a cough, sinus congestion, mild upset stomach over the last 3-4 days.   She has had just "little threads of blood" when she coughs- not sure if this is just from her throat. She says it is blood tinged mucus and not just blood.   Her throat had been sore but this is now better.  She is coughing up mucus.    She has tried OTC medications but does not seem to be improving.   She has felt hot and cold, but has not measured a fever.   No body aches.   No vomiting or diarrhea.   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   She has never been a smoker.    Patient Active Problem List   Diagnosis Date Noted  . Hypertension 04/08/2012  . Diverticulitis large intestine 03/23/2012  . HTN, goal below 130/80 03/14/2012  . Vitamin D deficiency 03/14/2012  . Heart murmur 03/06/2012    Past Medical History  Diagnosis Date  . Hypertension   . Pneumonia 02/2012  . GERD (gastroesophageal reflux disease)   . History of colonic diverticulitis   . Anxiety     Past Surgical History  Procedure Laterality Date  . Bunyonectomy    . Abdominal hysterectomy      History  Substance Use Topics  . Smoking status: Never Smoker   . Smokeless tobacco: Never Used  . Alcohol Use: No     Comment: occa    Family History  Problem Relation Age of Onset  . Heart failure Father   . Arthritis Mother     No Known Allergies  Medication list has been reviewed and updated.  Current Outpatient Prescriptions on File Prior to Visit  Medication Sig Dispense Refill  . acetaminophen (TYLENOL) 500  MG tablet Take 500 mg by mouth every 6 (six) hours as needed. headache      . cholecalciferol (VITAMIN D) 1000 UNITS tablet Take 1,000 Units by mouth daily.      . hydrOXYzine (ATARAX/VISTARIL) 10 MG tablet TAKE 1 TABLET BY MOUTH EVERY MORNING 1 AT MIDDAY AND 1 OR 2 FOR SLEEP AS DIRECTED  60 tablet  3  . losartan-hydrochlorothiazide (HYZAAR) 100-25 MG per tablet Take 1 tablet by mouth daily. PATIENT NEEDS OFFICE VISIT FOR ADDITIONAL REFILLS  30 tablet  0   No current facility-administered medications on file prior to visit.    Review of Systems:  As per HPI- otherwise negative.   Physical Examination: Filed Vitals:   01/27/14 1246  BP: 158/98  Pulse: 83  Temp: 98.2 F (36.8 C)  Resp: 18   Filed Vitals:   01/27/14 1246  Height: 5' (1.524 m)  Weight: 164 lb 12.8 oz (74.753 kg)   Body mass index is 32.19 kg/(m^2). Ideal Body Weight: Weight in (lb) to have BMI = 25: 127.7  GEN: WDWN, NAD, Non-toxic, A & O x 3, looks well, obese,   HEENT: Atraumatic, Normocephalic. Neck supple. No masses, No LAD.  Bilateral  TM wnl, oropharynx normal.  PEERL,EOMI.   Ears and Nose: No external deformity. CV: RRR, No M/G/R. No JVD. No thrill. No extra heart sounds. PULM: CTA B, no wheezes, crackles, rhonchi. No retractions. No resp. distress. No accessory muscle use. ABD: S, NT, ND, +BS. No rebound. No HSM. EXTR: No c/c/e NEURO Normal gait.  PSYCH: Normally interactive. Conversant. Not depressed or anxious appearing.  Calm demeanor.   [facility] reading (PRIMARY) by  Dr. Patsy Lager. CXR:  negative  CHEST 2 VIEW  COMPARISON: 03/06/2012, 02/23/2012.  FINDINGS: Mediastinum and hilar structures are normal. Cardiomegaly with normal pulmonary vascularity. Cardiomegaly is stable. Stable lingular pleural parenchymal thickening is noted consistent with scarring. No acute infiltrate. No pleural effusion or pneumothorax. Bochdalek's hernia again noted on the left.  IMPRESSION: 1. Stable cardiomegaly. No  CHF. 2. Stable lingula pleural parenchymal scarring. 3. Stable Bochdalek's hernia.  Wt Readings from Last 3 Encounters:  01/27/14 164 lb 12.8 oz (74.753 kg)  04/10/13 165 lb (74.844 kg)  12/05/12 161 lb (73.029 kg)     Assessment and Plan: Cough - Plan: DG Chest 2 View, HYDROcodone-homatropine (HYCODAN) 5-1.5 MG/5ML syrup  Hemoptysis - Plan: DG Chest 2 View, azithromycin (ZITHROMAX) 250 MG tablet  Cough for a few days.  CXR looks good.  Will treat with azithromycin for bronchitis. Hycodan as needed for cough.  She will let me know if not better soon- Sooner if worse.   Cautioned regarding sedation with cough syrup.   She will follow- up if she continues to cough up blood, but suspect this is drainage from her sinuses   Signed Abbe Amsterdam, MD

## 2014-02-05 ENCOUNTER — Other Ambulatory Visit: Payer: Self-pay | Admitting: Family Medicine

## 2014-02-21 ENCOUNTER — Encounter: Payer: Self-pay | Admitting: Family Medicine

## 2014-02-21 ENCOUNTER — Other Ambulatory Visit: Payer: Self-pay

## 2014-02-21 ENCOUNTER — Ambulatory Visit (INDEPENDENT_AMBULATORY_CARE_PROVIDER_SITE_OTHER): Payer: BC Managed Care – PPO | Admitting: Family Medicine

## 2014-02-21 VITALS — BP 158/87 | HR 74 | Temp 98.1°F | Resp 16 | Ht 60.5 in | Wt 167.0 lb

## 2014-02-21 DIAGNOSIS — I10 Essential (primary) hypertension: Secondary | ICD-10-CM

## 2014-02-21 DIAGNOSIS — Z23 Encounter for immunization: Secondary | ICD-10-CM

## 2014-02-21 DIAGNOSIS — M25562 Pain in left knee: Secondary | ICD-10-CM

## 2014-02-21 MED ORDER — LOSARTAN POTASSIUM-HCTZ 100-25 MG PO TABS: 1.0000 | ORAL_TABLET | Freq: Every day | ORAL | Status: DC

## 2014-02-21 NOTE — Patient Instructions (Addendum)
Influenza Vaccine (Flu Vaccine, Inactivated or Recombinant) 2014-2015: What You Need to Know 1. Why get vaccinated? Influenza ("flu") is a contagious disease that spreads around the [place] every winter, usually between October and May. Flu is caused by influenza viruses, and is spread mainly by coughing, sneezing, and close contact. Anyone can get flu, but the risk of getting flu is highest among children. Symptoms come on suddenly and may last several days. They can include:  fever/chills  sore throat  muscle aches  fatigue  cough  headache  runny or stuffy nose Flu can make some people much sicker than others. These people include young children, people 65 and older, pregnant women, and people with certain health conditions-such as heart, lung or kidney disease, nervous system disorders, or a weakened immune system. Flu vaccination is especially important for these people, and anyone in close contact with them. Flu can also lead to pneumonia, and make existing medical conditions worse. It can cause diarrhea and seizures in children. Each year thousands of people in the [place] die from flu, and many more are hospitalized. Flu vaccine is the best protection against flu and its complications. Flu vaccine also helps prevent spreading flu from person to person. 2. Inactivated and recombinant flu vaccines You are getting an injectable flu vaccine, which is either an "inactivated" or "recombinant" vaccine. These vaccines do not contain any live influenza virus. They are given by injection with a needle, and often called the "flu shot."  A different live, attenuated (weakened) influenza vaccine is sprayed into the nostrils. This vaccine is described in a separate Vaccine Information Statement. Flu vaccination is recommended every year. Some children 6 months through 8 years of age might need two doses during one year. Flu viruses are always changing. Each year's flu vaccine is made  to protect against 3 or 4 viruses that are likely to cause disease that year. Flu vaccine cannot prevent all cases of flu, but it is the best defense against the disease.  It takes about 2 weeks for protection to develop after the vaccination, and protection lasts several months to a year. Some illnesses that are not caused by influenza virus are often mistaken for flu. Flu vaccine will not prevent these illnesses. It can only prevent influenza. Some inactivated flu vaccine contains a very small amount of a mercury-based preservative called thimerosal. Studies have shown that thimerosal in vaccines is not harmful, but flu vaccines that do not contain a preservative are available. 3. Some people should not get this vaccine Tell the person who gives you the vaccine:  If you have any severe, life-threatening allergies. If you ever had a life-threatening allergic reaction after a dose of flu vaccine, or have a severe allergy to any part of this vaccine, including (for example) an allergy to gelatin, antibiotics, or eggs, you may be advised not to get vaccinated. Most, but not all, types of flu vaccine contain a small amount of egg protein.  If you ever had Guillain-Barr Syndrome (a severe paralyzing illness, also called GBS). Some people with a history of GBS should not get this vaccine. This should be discussed with your doctor.  If you are not feeling well. It is usually okay to get flu vaccine when you have a mild illness, but you might be advised to wait until you feel better. You should come back when you are better. 4. Risks of a vaccine reaction With a vaccine, like any medicine, there is a chance of side   effects. These are usually mild and go away on their own. Problems that could happen after any vaccine:  Brief fainting spells can happen after any medical procedure, including vaccination. Sitting or lying down for about 15 minutes can help prevent fainting, and injuries caused by a fall. Tell  your doctor if you feel dizzy, or have vision changes or ringing in the ears.  Severe shoulder pain and reduced range of motion in the arm where a shot was given can happen, very rarely, after a vaccination.  Severe allergic reactions from a vaccine are very rare, estimated at less than 1 in a million doses. If one were to occur, it would usually be within a few minutes to a few hours after the vaccination. Mild problems following inactivated flu vaccine:  soreness, redness, or swelling where the shot was given  hoarseness  sore, red or itchy eyes  cough  fever  aches  headache  itching  fatigue If these problems occur, they usually begin soon after the shot and last 1 or 2 days. Moderate problems following inactivated flu vaccine:  Young children who get inactivated flu vaccine and pneumococcal vaccine (PCV13) at the same time may be at increased risk for seizures caused by fever. Ask your doctor for more information. Tell your doctor if a child who is getting flu vaccine has ever had a seizure. Inactivated flu vaccine does not contain live flu virus, so you cannot get the flu from this vaccine. As with any medicine, there is a very remote chance of a vaccine causing a serious injury or death. The safety of vaccines is always being monitored. For more information, visit: www.cdc.gov/vaccinesafety/ 5. What if there is a serious reaction? What should I look for?  Look for anything that concerns you, such as signs of a severe allergic reaction, very high fever, or behavior changes. Signs of a severe allergic reaction can include hives, swelling of the face and throat, difficulty breathing, a fast heartbeat, dizziness, and weakness. These would start a few minutes to a few hours after the vaccination. What should I do?  If you think it is a severe allergic reaction or other emergency that can't wait, call 9-1-1 and get the person to the nearest hospital. Otherwise, call your  doctor.  Afterward, the reaction should be reported to the Vaccine Adverse Event Reporting System (VAERS). Your doctor should file this report, or you can do it yourself through the VAERS web site at www.vaers.hhs.gov, or by calling 1-800-822-7967. VAERS does not give medical advice. 6. The National Vaccine Injury Compensation Program The National Vaccine Injury Compensation Program (VICP) is a federal program that was created to compensate people who may have been injured by certain vaccines. Persons who believe they may have been injured by a vaccine can learn about the program and about filing a claim by calling 1-800-338-2382 or visiting the VICP website at www.hrsa.gov/vaccinecompensation. There is a time limit to file a claim for compensation. 7. How can I learn more?  Ask your health care provider.  Call your local or state health department.  Contact the Centers for Disease Control and Prevention (CDC):  Call 1-800-232-4636 (1-800-CDC-INFO) or  Visit CDC's website at www.cdc.gov/flu CDC Vaccine Information Statement (Interim) Inactivated Influenza Vaccine (12/25/2012) Document Released: 02/17/2006 Document Revised: 09/09/2013 Document Reviewed: 04/12/2013 [employer] Patient Information 2015 [employer]. This information is not intended to replace advice given to you by your health care provider. Make sure you discuss any questions you have with your health   care provider.        Mediterranean Diet  Why follow it? Research shows.   Those who follow the Mediterranean diet have a reduced risk of heart disease    The diet is associated with a reduced incidence of Parkinson's and Alzheimer's diseases   People following the diet may have longer life expectancies and lower rates of chronic diseases    The Dietary Guidelines for Americans recommends the Mediterranean diet as an eating plan to promote health and prevent disease  What Is the Mediterranean Diet?    Healthy eating plan  based on typical foods and recipes of Mediterranean-style cooking   The diet is primarily a plant based diet; these foods should make up a majority of meals   Starches - Plant based foods should make up a majority of meals - They are an important sources of vitamins, minerals, energy, antioxidants, and fiber - Choose whole grains, foods high in fiber and minimally processed items  - Typical grain sources include wheat, oats, barley, corn, brown rice, bulgar, farro, millet, polenta, couscous  - Various types of beans include chickpeas, lentils, fava beans, black beans, white beans   Fruits  Veggies - Large quantities of antioxidant rich fruits & veggies; 6 or more servings  - Vegetables can be eaten raw or lightly drizzled with oil and cooked  - Vegetables common to the traditional Mediterranean Diet include: artichokes, arugula, beets, broccoli, brussel sprouts, cabbage, carrots, celery, collard greens, cucumbers, eggplant, kale, leeks, lemons, lettuce, mushrooms, okra, onions, peas, peppers, potatoes, pumpkin, radishes, rutabaga, shallots, spinach, sweet potatoes, turnips, zucchini - Fruits common to the Mediterranean Diet include: apples, apricots, avocados, cherries, clementines, dates, figs, grapefruits, grapes, melons, nectarines, oranges, peaches, pears, pomegranates, strawberries, tangerines  Fats - Replace butter and margarine with healthy oils, such as olive oil, canola oil, and tahini  - Limit nuts to no more than a handful a day  - Nuts include walnuts, almonds, pecans, pistachios, pine nuts  - Limit or avoid candied, honey roasted or heavily salted nuts - Olives are central to the [employer]iterranean diet - can be eaten whole or used in a variety of dishes   Meats Protein - Limiting red meat: no more than a few times a month - When eating red meat: choose lean cuts and keep the portion to the size of deck of cards - Eggs: approx. 0 to 4 times a week  - Fish and lean poultry: at least 2 a  week  - Healthy protein sources include, chicken, [place]key, lean beef, lamb - Increase intake of seafood such as tuna, salmon, trout, mackerel, shrimp, scallops - Avoid or limit high fat processed meats such as sausage and bacon  Dairy - Include moderate amounts of low fat dairy products  - Focus on healthy dairy such as fat free yogurt, skim milk, low or reduced fat cheese - Limit dairy products higher in fat such as whole or 2% milk, cheese, ice cream  Alcohol - Moderate amounts of red wine is ok  - No more than 5 oz daily for women (all ages) and men older than age 265  - No more than 10 oz of wine daily for men younger than 4965  Other - Limit sweets and other desserts  - Use herbs and spices instead of salt to flavor foods  - Herbs and spices common to the traditional Mediterranean Diet include: basil, bay leaves, chives, cloves, cumin, fennel, garlic, lavender, marjoram, mint, oregano, parsley, pepper, rosemary, sage, savory,  sumac, tarragon, thyme   It's not just a diet, it's a lifestyle:    The Mediterranean diet includes lifestyle factors typical of those in the region    Foods, drinks and meals are best eaten with others and savored   Daily physical activity is important for overall good health   This could be strenuous exercise like running and aerobics   This could also be more leisurely activities such as walking, housework, yard-work, or taking the stairs   Moderation is the key; a balanced and healthy diet accommodates most foods and drinks   Consider portion sizes and frequency of consumption of certain foods   Meal Ideas & Options:    Breakfast:  o Whole wheat toast or whole wheat English muffins with peanut butter & hard boiled egg o Steel cut oats topped with apples & cinnamon and skim milk  o Fresh fruit: banana, strawberries, melon, berries, peaches  o Smoothies: strawberries, bananas, greek yogurt, peanut butter o Low fat greek yogurt with blueberries and granola  o Egg  white omelet with spinach and mushrooms o Breakfast couscous: whole wheat couscous, apricots, skim milk, cranberries    Sandwiches:  o Hummus and grilled vegetables (peppers, zucchini, squash) on whole wheat bread   o Grilled chicken on whole wheat pita with lettuce, tomatoes, cucumbers or tzatziki  o Tuna salad on whole wheat bread: tuna salad made with greek yogurt, olives, red peppers, capers, green onions o Garlic rosemary lamb pita: lamb sauted with garlic, rosemary, salt & pepper; add lettuce, cucumber, greek yogurt to pita - flavor with lemon juice and black pepper    Seafood:  o Mediterranean grilled salmon, seasoned with garlic, basil, parsley, lemon juice and black pepper o Shrimp, lemon, and spinach whole-grain pasta salad made with low fat greek yogurt  o Seared scallops with lemon orzo  o Seared tuna steaks seasoned salt, pepper, coriander topped with tomato mixture of olives, tomatoes, olive oil, minced garlic, parsley, green onions and cappers    Meats:  o Herbed greek chicken salad with kalamata olives, cucumber, feta  o Red bell peppers stuffed with spinach, bulgur, lean ground beef (or lentils) & topped with feta   o Kebabs: skewers of chicken, tomatoes, onions, zucchini, squash  o [place]urkey burgers: made with red onions, mint, dill, lemon juice, feta cheese topped with roasted red peppers   Vegetarian o Cucumber salad: cucumbers, artichoke hearts, celery, red onion, feta cheese, tossed in olive oil & lemon juice  o Hummus and whole grain pita points with a greek salad (lettuce, tomato, feta, olives, cucumbers, red onion) o Lentil soup with celery, carrots made with vegetable broth, garlic, salt and pepper  o Tabouli salad: parsley, bulgur, mint, scallions, cucumbers, tomato, radishes, lemon juice, olive oil, salt and pepper. o

## 2014-02-23 DIAGNOSIS — I10 Essential (primary) hypertension: Secondary | ICD-10-CM | POA: Insufficient documentation

## 2014-02-23 NOTE — Progress Notes (Signed)
S:  This 63 y.o. AA female who is here for HTN follow-up. She has fully recovered from respiratory illness treated about 3 weeks ago. She is compliant w/ BP medication. No reports on fatigue, diaphoresis, vision disturbances, CP or tightness, palpitations, SOB or DOE, cough, HA, dizziness, weakness, numbness or syncope.   She has mild intermittent knee L pain, most bothersome when climbing stairs. It feels like it might give way but she has not fallen. Also has mild AM stiffness but not significant enough for daily medication or topical analgesic. No swelling, numbness or weakness.  Pt not sure when her next CRS is due; she recalls GI specialist advising her that she should have it repeated in ~ 2-3 years. Some polyps were removed. Currently, she has no GI complaints.  Patient Active Problem List   Diagnosis Date Noted  . Hypertension 04/08/2012  . Diverticulitis large intestine 03/23/2012  . HTN, goal below 130/80 03/14/2012  . Vitamin D deficiency 03/14/2012  . Heart murmur 03/06/2012    Prior to Admission medications   Medication Sig Start Date End Date Taking? Authorizing Provider  acetaminophen (TYLENOL) 500 MG tablet Take 500 mg by mouth every 6 (six) hours as needed. headache   Yes Historical Provider, MD  cholecalciferol (VITAMIN D) 1000 UNITS tablet Take 1,000 Units by mouth daily.   Yes Historical Provider, MD  hydrOXYzine (ATARAX/VISTARIL) 10 MG tablet TAKE 1 TABLET BY MOUTH EVERY MORNING 1 AT MIDDAY AND 1 OR 2 FOR SLEEP AS DIRECTED 12/05/12  Yes Maurice MarchBarbara B Mistina Coatney, MD  losartan-hydrochlorothiazide (HYZAAR) 100-25 MG per tablet Take 1 tablet by mouth daily.   Yes Maurice MarchBarbara B Khyren Hing, MD    ROS: As per HPI.  Ceasar Mons: Filed Vitals:   02/21/14 1136  BP: 158/87  Pulse: 74  Temp: 98.1 F (36.7 C)  Resp: 16    GEN: In NAD: WN,WD. HENT: [place]/AT; EOIM w/ clear conj/sclerae. Otherwise unremarkable. COR: RRR. LUNGS: Normal resp rate and effort. ABD: Soft and NT; normal BS w/o HSM or  masses. MS: Knees- FROM w/ normal appearance. No deformities, no effusion or warmth. McMurray's sign- negative. No ligamnet laxity noted. NEURO: A&O x 3; CNs intact. Nonfocal. Gait normal.  A/P: HTN, goal below 150/90- Stable on current medication. No change. Advised about watching weight. Pt will start a walking exercise plan, 10-15 minutes most days of the week.  Left knee pain- No action required at this time.  (Re: CRS- Pt will contact Dr. Haywood PaoHung's office to get accurate information about recall).   Need for prophylactic vaccination and inoculation against influenza - Plan: Flu Vaccine QUAD 36+ mos IM  Meds ordered this encounter  Medications  . losartan-hydrochlorothiazide (HYZAAR) 100-25 MG per tablet    Sig: Take 1 tablet by mouth daily.    Dispense:  30 tablet    Refill:  5

## 2014-03-30 IMAGING — CR DG CHEST 2V
3 series · 3 of 3 positions shown · non-contrast
Comparison: None.

CLINICAL DATA: Weakness.  Dizziness.  Hypertension.

CHEST - 2 VIEW

[x chest ap]
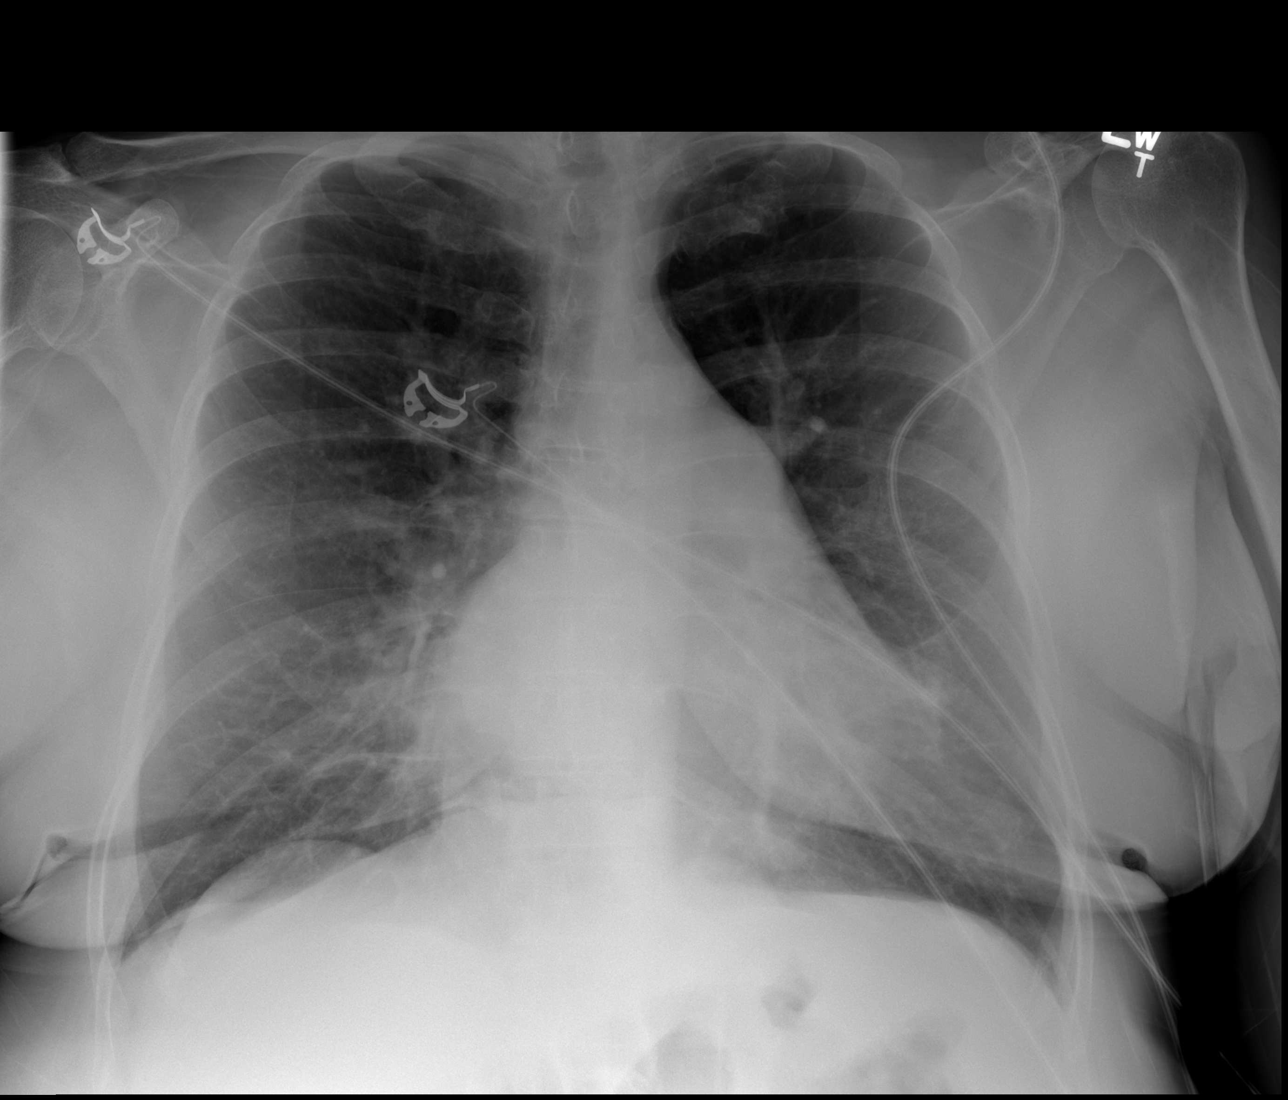

[w chest lat (1 of 2)]
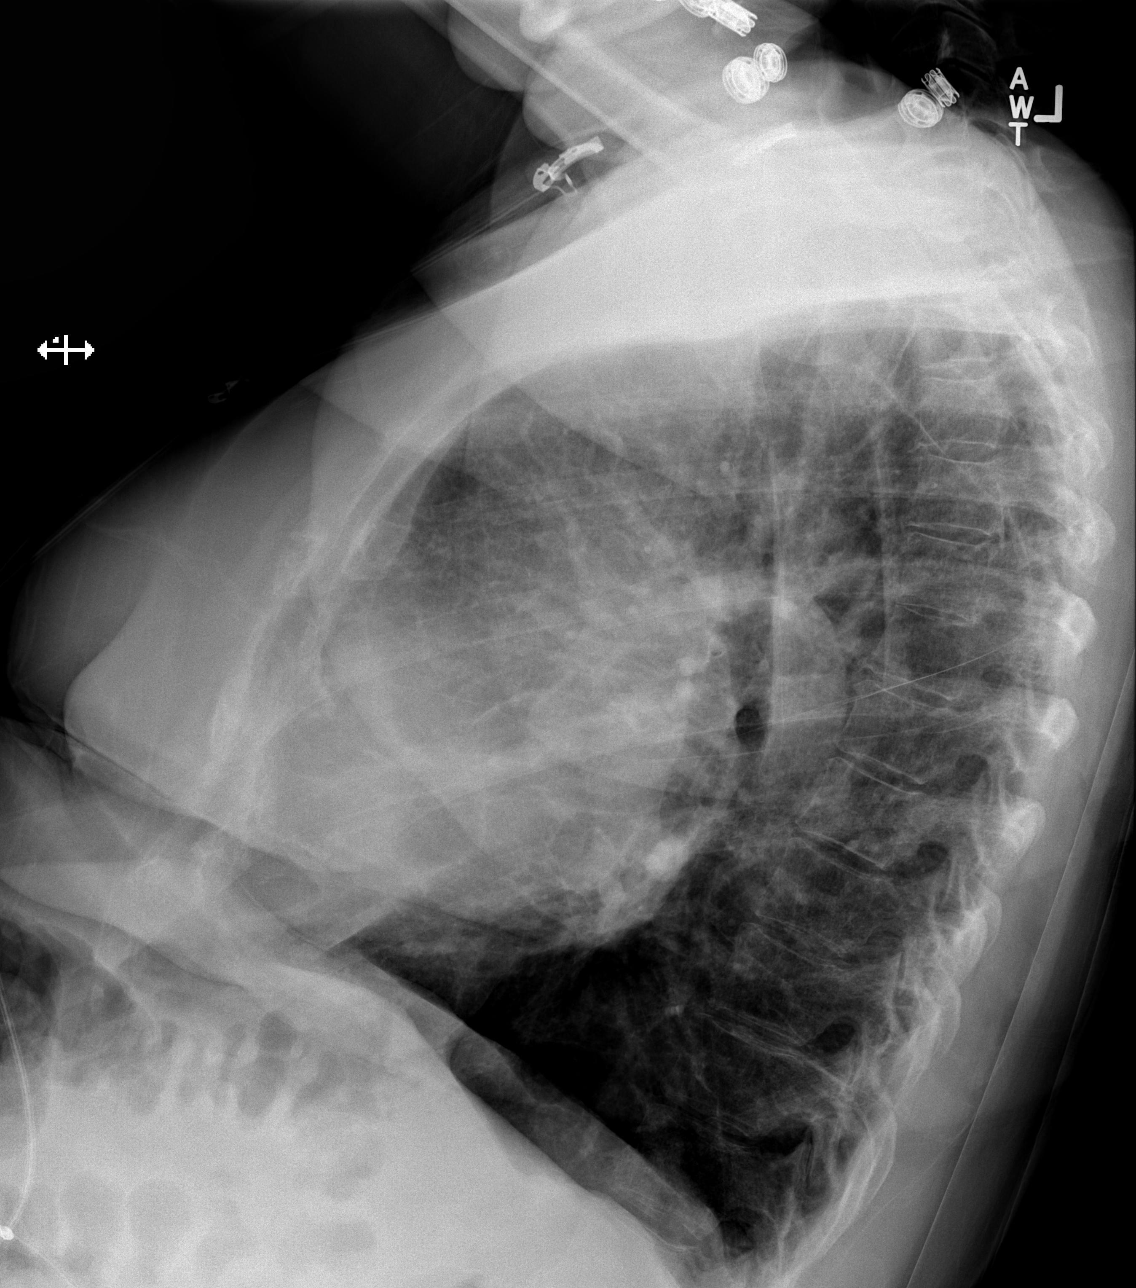

[w chest lat (2 of 2)]
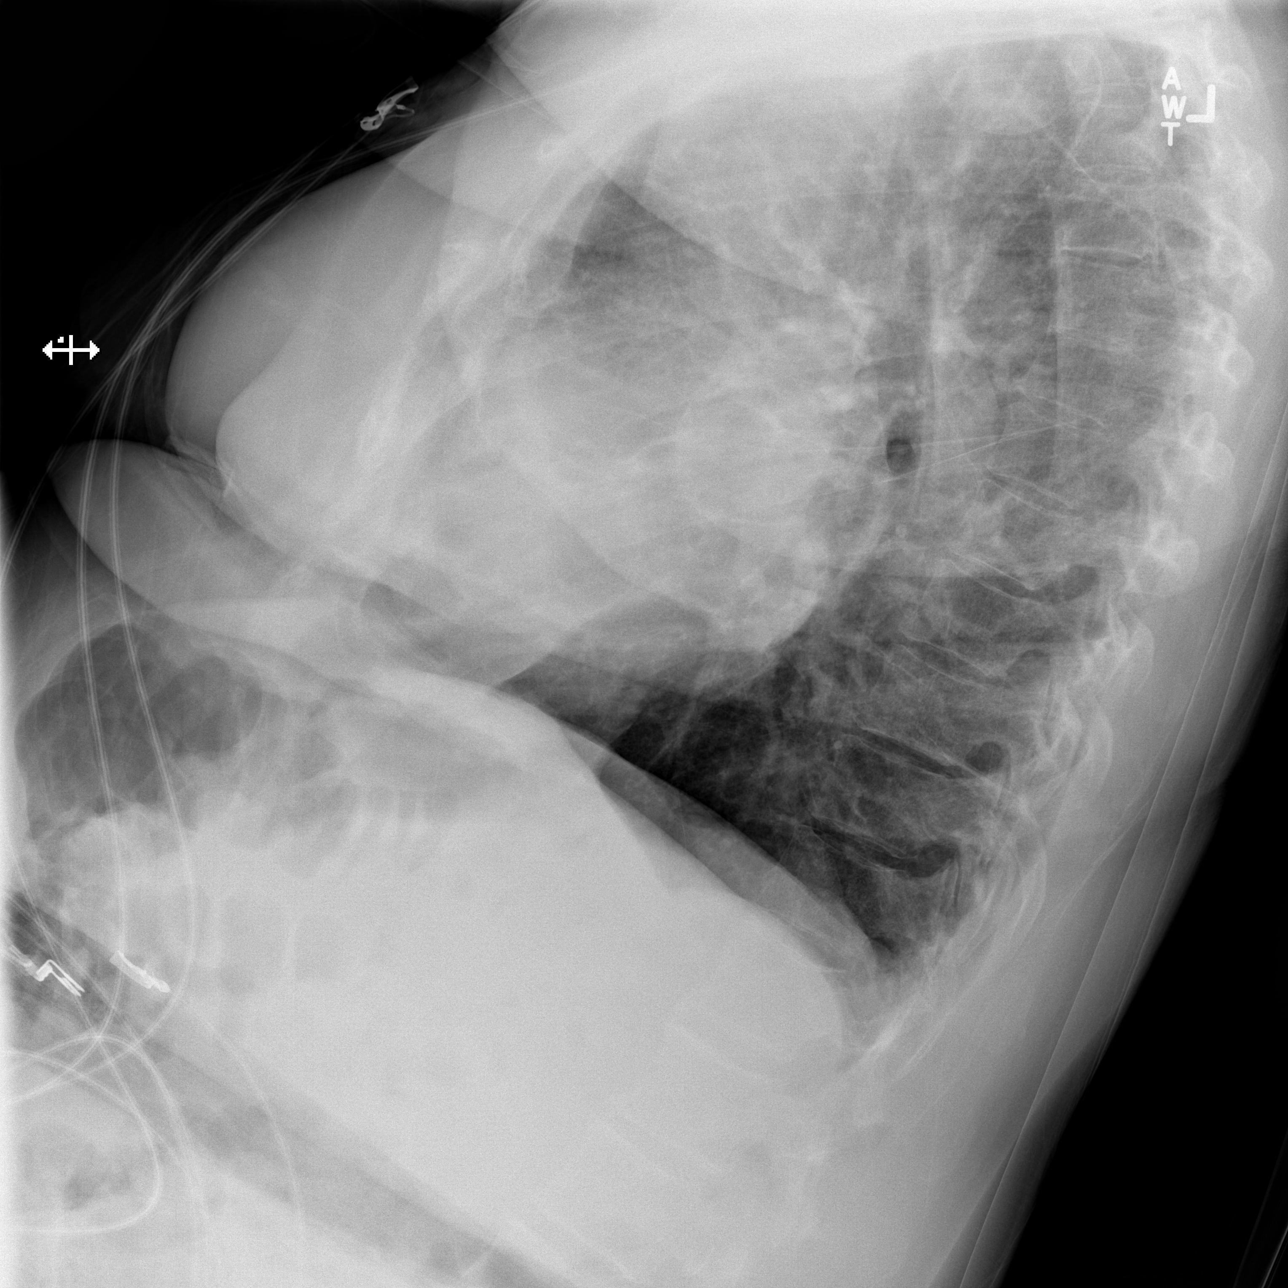

[3 of 3 positions shown; findings below may reference images not displayed]

FINDINGS: Suspected left atrial enlargement.  Possible main
pulmonary arterial enlargement.

Indistinct airspace opacity along the right heart border,
suspicious for lingular atelectasis or pneumonia.

Left heart border somewhat indistinct, probably from prominent
epicardial adipose tissue.  The cardiothoracic ratio is within
normal limits for technique.  Large lung volumes noted.

No discrete pleural effusion observed.
IMPRESSION: 1.  Mild prominence of right heart border raising suspicion for
left atrial enlargement.  Equivocal splaying of the carina is a
complementary finding.  Along the left heart border, left atrial
appendage is not definitively enlarged, although there is a
suggestion of a potentially enlarged main pulmonary artery (query
pulmonary arterial hypertension), and slight obscuration of the AP
window.  Adenopathy in this region cannot be totally excluded.
2.  Mild airspace opacity in the right middle lobe suspicious for
pneumonia or atelectasis.
3. Further workup findings through CTA chest and / or
echocardiography may be helpful.

## 2014-06-27 ENCOUNTER — Encounter: Payer: BC Managed Care – PPO | Admitting: Family Medicine

## 2014-08-30 ENCOUNTER — Other Ambulatory Visit: Payer: Self-pay | Admitting: Family Medicine

## 2014-10-03 ENCOUNTER — Other Ambulatory Visit: Payer: Self-pay | Admitting: Physician Assistant

## 2014-10-13 ENCOUNTER — Encounter: Payer: Self-pay | Admitting: *Deleted

## 2014-11-02 ENCOUNTER — Other Ambulatory Visit: Payer: Self-pay | Admitting: Physician Assistant

## 2014-11-04 ENCOUNTER — Encounter: Payer: Self-pay | Admitting: Family Medicine

## 2014-11-04 ENCOUNTER — Ambulatory Visit (INDEPENDENT_AMBULATORY_CARE_PROVIDER_SITE_OTHER): Payer: BC Managed Care – PPO | Admitting: Family Medicine

## 2014-11-04 VITALS — BP 143/90 | HR 83 | Temp 98.4°F | Resp 16 | Ht 60.5 in | Wt 163.0 lb

## 2014-11-04 DIAGNOSIS — K625 Hemorrhage of anus and rectum: Secondary | ICD-10-CM | POA: Diagnosis not present

## 2014-11-04 NOTE — Progress Notes (Signed)
   Subjective:    Patient ID: Victoria Hogan, female    DOB: 09/24/1950, 64 y.o.   MRN: [id number]  HPI Patient presents today complaining of blood in her stool. She reports that this has been going on for "a long time." She is usually very regular with her bowel movements. Had 3-4 bowel movements yesterday. Up until yesterday, she was having 2-5 episodes a month with crampy abdominal pain and stool with red blood on stool. Yesterday she had more bowel movements than usual and noticed blood on the stool and in the toilet bowel.  Saw Dr. Elnoria HowardHung in 2013 and had a colonoscopy and was diagnosed with diverticulitis and had some polyps removed. She is due for repeat colonoscopy 05/2015. Has known hemorrhoids.  Has generalized abdominal pain constantly for at least 1 year. Has to be careful with what she eats. Has tried some OTC medications without relief.   Has noticed more fatigue in the last 6 months. Rides an exercise bike 2-3x week, no change in exercise tolerance.   Past Medical History  Diagnosis Date  . Hypertension   . Pneumonia 02/2012  . GERD (gastroesophageal reflux disease)   . History of colonic diverticulitis   . Anxiety    Past Surgical History  Procedure Laterality Date  . Bunyonectomy    . Abdominal hysterectomy     Family History  Problem Relation Age of Onset  . Heart failure Father   . Arthritis Mother    History  Substance Use Topics  . Smoking status: Never Smoker   . Smokeless tobacco: Never Used  . Alcohol Use: No     Comment: occa     Review of Systems No chest pain, no SOB, no nausea/no vomiting, no edema.     Objective:   Physical Exam Physical Exam  Constitutional: She is oriented to person, place, and time. She appears well-developed and well-nourished. No distress.  HENT:  Head: Normocephalic and atraumatic.  Neck: Normal range of motion. Neck supple. Cardiovascular: Normal rate, regular rhythm, normal heart sounds and intact distal pulses.     Pulmonary/Chest: Effort normal and breath sounds normal.  Abdominal: Soft. Bowel sounds are normal. She exhibits no distension and no mass. There is no tenderness. There is no rebound and no guarding.  Musculoskeletal: Normal range of motion. She exhibits no edema.  Lymphadenopathy:    She has no cervical adenopathy.  Neurological: She is alert and oriented to person, place, and time. She has normal reflexes.  Skin: Skin is warm and dry. She is not diaphoretic.  Psychiatric: She has a normal mood and affect. Her behavior is normal. Judgment and thought content normal.  Vitals reviewed.  BP 143/90 mmHg  Pulse 83  Temp(Src) 98.4 F (36.9 C) (Oral)  Resp 16  Ht 5' 0.5" (1.537 m)  Wt 163 lb (73.936 kg)  BMI 31.30 kg/m2  SpO2 97% Wt Readings from Last 3 Encounters:  11/04/14 163 lb (73.936 kg)  02/21/14 167 lb (75.751 kg)  01/27/14 164 lb 12.8 oz (74.753 kg)      Assessment & Plan:  1. Rectal bleeding - Appointment with Dr. Elnoria HowardHung tomorrow  - will defer labs to him - follow up for CPE in 3 months   Olean Reeeborah Suhey Radford, FNP-BC  [facility]  11/07/2014 12:02 AM

## 2014-11-04 NOTE — Patient Instructions (Signed)
Appointment with [facility]nor[facility]Hung at [facility]:30 Wed., November 05, 2014.

## 2014-11-18 ENCOUNTER — Other Ambulatory Visit: Payer: Self-pay | Admitting: Physician Assistant

## 2014-11-24 ENCOUNTER — Encounter: Payer: Self-pay | Admitting: Family Medicine

## 2014-11-24 ENCOUNTER — Ambulatory Visit (INDEPENDENT_AMBULATORY_CARE_PROVIDER_SITE_OTHER): Payer: BC Managed Care – PPO | Admitting: Family Medicine

## 2014-11-24 VITALS — BP 146/92 | HR 88 | Temp 98.1°F | Resp 18 | Ht 60.0 in | Wt 165.4 lb

## 2014-11-24 DIAGNOSIS — I10 Essential (primary) hypertension: Secondary | ICD-10-CM

## 2014-11-24 MED ORDER — LOSARTAN POTASSIUM-HCTZ 100-25 MG PO TABS: ORAL_TABLET | ORAL | Status: DC

## 2014-11-24 NOTE — Progress Notes (Signed)
Patient presents today for follow up of rectal bleeding. She saw Dr. Elnoria HowardHung and has an appointment with him for next month. She is not sure why she is here today, neither am I. She has an appointment scheduled for CPE 02/09/15. Refilled hyzaar.

## 2015-02-03 ENCOUNTER — Telehealth: Payer: Self-pay | Admitting: Family Medicine

## 2015-02-03 DIAGNOSIS — Z1239 Encounter for other screening for malignant neoplasm of breast: Secondary | ICD-10-CM

## 2015-02-03 NOTE — Telephone Encounter (Signed)
Spoke with patient about her mammogram.and she would like to go ahead and have it set up before she comes in for her physical.  We are going to set it up at [facility].

## 2015-02-09 ENCOUNTER — Encounter: Payer: Self-pay | Admitting: Family Medicine

## 2015-02-09 ENCOUNTER — Ambulatory Visit (INDEPENDENT_AMBULATORY_CARE_PROVIDER_SITE_OTHER): Payer: BC Managed Care – PPO | Admitting: Family Medicine

## 2015-02-09 VITALS — BP 150/96 | HR 97 | Temp 98.1°F | Resp 16 | Ht 60.0 in | Wt 164.0 lb

## 2015-02-09 DIAGNOSIS — Z23 Encounter for immunization: Secondary | ICD-10-CM | POA: Diagnosis not present

## 2015-02-09 DIAGNOSIS — Z0000 Encounter for general adult medical examination without abnormal findings: Secondary | ICD-10-CM | POA: Diagnosis not present

## 2015-02-09 DIAGNOSIS — Z114 Encounter for screening for human immunodeficiency virus [HIV]: Secondary | ICD-10-CM | POA: Diagnosis not present

## 2015-02-09 DIAGNOSIS — H9192 Unspecified hearing loss, left ear: Secondary | ICD-10-CM | POA: Diagnosis not present

## 2015-02-09 DIAGNOSIS — E559 Vitamin D deficiency, unspecified: Secondary | ICD-10-CM

## 2015-02-09 DIAGNOSIS — I10 Essential (primary) hypertension: Secondary | ICD-10-CM | POA: Diagnosis not present

## 2015-02-09 LAB — COMPREHENSIVE METABOLIC PANEL
ALK PHOS: 80 U/L (ref 33–130)
ALT: 15 U/L (ref 6–29)
AST: 19 U/L (ref 10–35)
Albumin: 4.2 g/dL (ref 3.6–5.1)
BILIRUBIN TOTAL: 0.5 mg/dL (ref 0.2–1.2)
BUN: 12 mg/dL (ref 7–25)
CALCIUM: 9.3 mg/dL (ref 8.6–10.4)
CO2: 26 mmol/L (ref 20–31)
Chloride: 103 mmol/L (ref 98–110)
Creat: 0.85 mg/dL (ref 0.50–0.99)
GLUCOSE: 98 mg/dL (ref 65–99)
Potassium: 4.7 mmol/L (ref 3.5–5.3)
Sodium: 137 mmol/L (ref 135–146)
Total Protein: 7.1 g/dL (ref 6.1–8.1)

## 2015-02-09 LAB — CBC
HEMATOCRIT: 43.6 % (ref 36.0–46.0)
HEMOGLOBIN: 15 g/dL (ref 12.0–15.0)
MCH: 31.6 pg (ref 26.0–34.0)
MCHC: 34.4 g/dL (ref 30.0–36.0)
MCV: 91.8 fL (ref 78.0–100.0)
MPV: 11.4 fL (ref 8.6–12.4)
Platelets: 189 10*3/uL (ref 150–400)
RBC: 4.75 MIL/uL (ref 3.87–5.11)
RDW: 13.3 % (ref 11.5–15.5)
WBC: 4.3 10*3/uL (ref 4.0–10.5)

## 2015-02-09 LAB — POCT URINALYSIS DIP (MANUAL ENTRY)
Bilirubin, UA: NEGATIVE
GLUCOSE UA: NEGATIVE
Ketones, POC UA: NEGATIVE
Leukocytes, UA: NEGATIVE
NITRITE UA: NEGATIVE
Protein Ur, POC: NEGATIVE
RBC UA: NEGATIVE
Spec Grav, UA: 1.015
Urobilinogen, UA: 0.2
pH, UA: 5

## 2015-02-09 LAB — HIV ANTIBODY (ROUTINE TESTING W REFLEX): HIV 1&2 Ab, 4th Generation: NONREACTIVE

## 2015-02-09 LAB — LIPID PANEL
Cholesterol: 190 mg/dL (ref 125–200)
HDL: 62 mg/dL (ref >=46)
LDL Cholesterol: 114 mg/dL (ref <130)
Total CHOL/HDL Ratio: 3.1 Ratio (ref <=5.0)
Triglycerides: 70 mg/dL (ref <150)
VLDL: 14 mg/dL (ref <30)

## 2015-02-09 MED ORDER — LOSARTAN POTASSIUM-HCTZ 100-25 MG PO TABS: ORAL_TABLET | ORAL | Status: DC

## 2015-02-09 MED ORDER — ZOSTER VACCINE LIVE 19400 UNT/0.65ML ~~LOC~~ SOLR: 0.6500 mL | Freq: Once | SUBCUTANEOUS | Status: DC

## 2015-02-09 NOTE — Progress Notes (Signed)
Subjective:    Patient ID: Victoria Hogan, female    DOB: [birth year], 64 y.o.   MRN: [id number]  HPI This is a pleasant 64 yo female who presents today for CPE.  Last CPE- 7/14 Mammo- scheduled for 02/18/15 Pap- hysterectomy Colonoscopy- 2013 Tdap- 12/05/12 Flu- today Eye- several years since last check up Dental- regular Exercise- twice a week, up and down stairs about 50 times a day with her job  She has had some problems with rectal bleeding and sees Dr. Elnoria Howard. She has been using nitrglycerin ointment with good relief.   She has been out of her hyzaar for about one month. She changed pharmacies and was never notified that her prescription was ready.   Past Medical History  Diagnosis Date  . Hypertension   . Pneumonia 02/2012  . GERD (gastroesophageal reflux disease)   . History of colonic diverticulitis   . Anxiety    Past Surgical History  Procedure Laterality Date  . Bunyonectomy    . Abdominal hysterectomy     Family History  Problem Relation Age of Onset  . Heart failure Father   . Arthritis Mother    Social History  Substance Use Topics  . Smoking status: Never Smoker   . Smokeless tobacco: Never Used  . Alcohol Use: No     Comment: occa    Review of Systems  Constitutional: Negative.   HENT: Positive for hearing loss (for several years, worse left ear, worse with background noise, no loud noise exposure.).   Eyes: Positive for photophobia and visual disturbance (using reading glasses more often).  Respiratory: Negative.   Cardiovascular: Negative.   Gastrointestinal: Negative.   Endocrine: Negative.   Genitourinary: Negative.   Musculoskeletal: Positive for back pain (stiff in the morning, no injury).  Skin: Negative.   Allergic/Immunologic: Negative.   Neurological: Positive for light-headedness (suspects from elevated blood pressure, no falls, lasts about 3-5 seconds. ).  Hematological: Negative.   Psychiatric/Behavioral: Negative.         Objective:   Physical Exam Physical Exam  Constitutional: She is oriented to person, place, and time. She appears well-developed and well-nourished. No distress.  HENT:  Head: Normocephalic and atraumatic.  Right Ear: External ear normal. Moderate amount cerumen, unable to visualize TM.  Left Ear: External ear normal. Canal and TM normal. Weber- lateralized to left ear, AC>BC bilateral. Nose: Nose normal.  Mouth/Throat: Oropharynx is clear and moist. No oropharyngeal exudate.  Eyes: Conjunctivae are normal. Pupils are equal, round, and reactive to light.  Neck: Normal range of motion. Neck supple. No JVD present. No thyromegaly present.  Cardiovascular: Normal rate, regular rhythm, normal heart sounds and intact distal pulses.   Pulmonary/Chest: Effort normal and breath sounds normal. Right breast exhibits no inverted nipple, no mass, no nipple discharge, no skin change and no tenderness. Left breast exhibits no inverted nipple, no mass, no nipple discharge, no skin change and no tenderness. Breasts are symmetrical.  Abdominal: Soft. Bowel sounds are normal. She exhibits no distension and no mass. There is no tenderness. There is no rebound and no guarding.  Musculoskeletal: Normal range of motion. She exhibits no edema or tenderness.  Lymphadenopathy:    She has no cervical adenopathy.  Neurological: She is alert and oriented to person, place, and time. She has normal reflexes.  Skin: Skin is warm and dry. She is not diaphoretic.  Psychiatric: She has a normal mood and affect. Her behavior is normal. Judgment and thought content normal.  Vitals reviewed. Recheck BP- 148/86 BP 150/96 mmHg  Pulse 97  Temp(Src) 98.1 F (36.7 C) (Oral)  Resp 16  Ht 5' (1.524 m)  Wt 164 lb (74.39 kg)  BMI 32.03 kg/m2 Wt Readings from Last 3 Encounters:  02/09/15 164 lb (74.39 kg)  11/24/14 165 lb 6.4 oz (75.025 kg)  11/04/14 163 lb (73.936 kg)   Depression screen [facility] 2/9 02/09/2015 11/24/2014  11/04/2014  Decreased Interest 0 0 0  Down, Depressed, Hopeless 0 3 0  PHQ - 2 Score 0 3 0  Altered sleeping - 0 -  Tired, decreased energy - 2 -  Change in appetite - 0 -  Feeling bad or failure about yourself  - 2 -  Trouble concentrating - 3 -  Moving slowly or fidgety/restless - 1 -  Suicidal thoughts - 0 -  PHQ-9 Score - 11 -  Difficult doing work/chores - Very difficult -      Assessment & Plan:  1. Annual physical exam - encouraged healthy behaviors- increased intensity of exercise, healthy food choices, stress relief measures.   2. Essential hypertension - POCT urinalysis dipstick - CBC - Comprehensive metabolic panel - Lipid panel - losartan-hydrochlorothiazide (HYZAAR) 100-25 MG tablet; TAKE 1 TABLET BY MOUTH EVERY DAY  Dispense: 90 tablet; Refill: 1  3. Vitamin D deficiency - Vit D  25 hydroxy (rtn osteoporosis monitoring)  4. Need for shingles vaccine - zoster vaccine live, PF, (ZOSTAVAX) [id number] UNT/0.65ML injection; Inject 19,400 Units into the skin once.  Dispense: 1 each; Refill: 0  5. Need for prophylactic vaccination and inoculation against influenza - Flu Vaccine QUAD 36+ mos IM  6. Screening for HIV without presence of risk factors - HIV antibody  7. Decreased hearing of left ear - Right ear irrigated with good results. Patient reports increased hearing.  - Offered patient ENT referral for mild hearing loss left ear. Patient will consider and discuss at follow up visit.  - Follow up in 3 months   Olean Ree, FNP-BC  [facility], [facility]  02/09/2015 12:40 PM

## 2015-02-09 NOTE — Patient Instructions (Addendum)
Stay active- try to increase the intensity of your exercise a couple of days a week Eat healthy snacks, don't get too hungry between meals or snacks     Why follow it? Research shows. . Those who follow the Mediterranean diet have a reduced risk of heart disease  . The diet is associated with a reduced incidence of Parkinson's and Alzheimer's diseases . People following the diet may have longer life expectancies and lower rates of chronic diseases  . The Dietary Guidelines for Americans recommends the Mediterranean diet as an eating plan to promote health and prevent disease  What Is the Mediterranean Diet?  . Healthy eating plan based on typical foods and recipes of Mediterranean-style cooking . The diet is primarily a plant based diet; these foods should make up a majority of meals   Starches - Plant based foods should make up a majority of meals - They are an important sources of vitamins, minerals, energy, antioxidants, and fiber - Choose whole grains, foods high in fiber and minimally processed items  - Typical grain sources include wheat, oats, barley, corn, brown rice, bulgar, farro, millet, polenta, couscous  - Various types of beans include chickpeas, lentils, fava beans, black beans, white beans   Fruits  Veggies - Large quantities of antioxidant rich fruits & veggies; 6 or more servings  - Vegetables can be eaten raw or lightly drizzled with oil and cooked  - Vegetables common to the traditional Mediterranean Diet include: artichokes, arugula, beets, broccoli, brussel sprouts, cabbage, carrots, celery, collard greens, cucumbers, eggplant, kale, leeks, lemons, lettuce, mushrooms, okra, onions, peas, peppers, potatoes, pumpkin, radishes, rutabaga, shallots, spinach, sweet potatoes, turnips, zucchini - Fruits common to the Mediterranean Diet include: apples, apricots, avocados, cherries, clementines, dates, figs, grapefruits, grapes, melons, nectarines, oranges, peaches, pears,  pomegranates, strawberries, tangerines  Fats - Replace butter and margarine with healthy oils, such as olive oil, canola oil, and tahini  - Limit nuts to no more than a handful a day  - Nuts include walnuts, almonds, pecans, pistachios, pine nuts  - Limit or avoid candied, honey roasted or heavily salted nuts - Olives are central to the [employer] - can be eaten whole or used in a variety of dishes   Meats Protein - Limiting red meat: no more than a few times a month - When eating red meat: choose lean cuts and keep the portion to the size of deck of cards - Eggs: approx. 0 to 4 times a week  - Fish and lean poultry: at least 2 a week  - Healthy protein sources include, chicken, [place], lean beef, lamb - Increase intake of seafood such as tuna, salmon, trout, mackerel, shrimp, scallops - Avoid or limit high fat processed meats such as sausage and bacon  Dairy - Include moderate amounts of low fat dairy products  - Focus on healthy dairy such as fat free yogurt, skim milk, low or reduced fat cheese - Limit dairy products higher in fat such as whole or 2% milk, cheese, ice cream  Alcohol - Moderate amounts of red wine is ok  - No more than 5 oz daily for women (all ages) and men older than age 52  - No more than 10 oz of wine daily for men younger than 79  Other - Limit sweets and other desserts  - Use herbs and spices instead of salt to flavor foods  - Herbs and spices common to the traditional Mediterranean Diet include: basil, bay leaves, chives, cloves,  cumin, fennel, garlic, lavender, marjoram, mint, oregano, parsley, pepper, rosemary, sage, savory, sumac, tarragon, thyme   It's not just a diet, it's a lifestyle:  . The Mediterranean diet includes lifestyle factors typical of those in the region  . Foods, drinks and meals are best eaten with others and savored . Daily physical activity is important for overall good health . This could be strenuous exercise like running and  aerobics . This could also be more leisurely activities such as walking, housework, yard-work, or taking the stairs . Moderation is the key; a balanced and healthy diet accommodates most foods and drinks . Consider portion sizes and frequency of consumption of certain foods   Meal Ideas & Options:  . Breakfast:  o Whole wheat toast or whole wheat English muffins with peanut butter & hard boiled egg o Steel cut oats topped with apples & cinnamon and skim milk  o Fresh fruit: banana, strawberries, melon, berries, peaches  o Smoothies: strawberries, bananas, greek yogurt, peanut butter o Low fat greek yogurt with blueberries and granola  o Egg white omelet with spinach and mushrooms o Breakfast couscous: whole wheat couscous, apricots, skim milk, cranberries  . Sandwiches:  o Hummus and grilled vegetables (peppers, zucchini, squash) on whole wheat bread   o Grilled chicken on whole wheat pita with lettuce, tomatoes, cucumbers or tzatziki  o Tuna salad on whole wheat bread: tuna salad made with greek yogurt, olives, red peppers, capers, green onions o Garlic rosemary lamb pita: lamb sauted with garlic, rosemary, salt & pepper; add lettuce, cucumber, greek yogurt to pita - flavor with lemon juice and black pepper  . Seafood:  o Mediterranean grilled salmon, seasoned with garlic, basil, parsley, lemon juice and black pepper o Shrimp, lemon, and spinach whole-grain pasta salad made with low fat greek yogurt  o Seared scallops with lemon orzo  o Seared tuna steaks seasoned salt, pepper, coriander topped with tomato mixture of olives, tomatoes, olive oil, minced garlic, parsley, green onions and cappers  . Meats:  o Herbed greek chicken salad with kalamata olives, cucumber, feta  o Red bell peppers stuffed with spinach, bulgur, lean ground beef (or lentils) & topped with feta   o Kebabs: skewers of chicken, tomatoes, onions, zucchini, squash  o [place] burgers: made with red onions, mint, dill,  lemon juice, feta cheese topped with roasted red peppers . Vegetarian o Cucumber salad: cucumbers, artichoke hearts, celery, red onion, feta cheese, tossed in olive oil & lemon juice  o Hummus and whole grain pita points with a greek salad (lettuce, tomato, feta, olives, cucumbers, red onion) o Lentil soup with celery, carrots made with vegetable broth, garlic, salt and pepper  o Tabouli salad: parsley, bulgur, mint, scallions, cucumbers, tomato, radishes, lemon juice, olive oil, salt and pepper.

## 2015-02-10 LAB — VITAMIN D 25 HYDROXY (VIT D DEFICIENCY, FRACTURES): VIT D 25 HYDROXY: 19 ng/mL — AB (ref 30–100)

## 2015-08-03 ENCOUNTER — Other Ambulatory Visit: Payer: Self-pay | Admitting: Family Medicine

## 2015-09-09 ENCOUNTER — Other Ambulatory Visit: Payer: Self-pay | Admitting: Family Medicine

## 2015-10-20 ENCOUNTER — Encounter: Payer: Self-pay | Admitting: Family Medicine

## 2015-10-20 ENCOUNTER — Ambulatory Visit (INDEPENDENT_AMBULATORY_CARE_PROVIDER_SITE_OTHER): Payer: BC Managed Care – PPO | Admitting: Family Medicine

## 2015-10-20 VITALS — BP 134/82 | HR 73 | Temp 98.1°F | Resp 16 | Ht 60.0 in | Wt 158.8 lb

## 2015-10-20 DIAGNOSIS — I10 Essential (primary) hypertension: Secondary | ICD-10-CM

## 2015-10-20 LAB — BASIC METABOLIC PANEL
BUN: 13 mg/dL (ref 7–25)
CO2: 28 mmol/L (ref 20–31)
Calcium: 9.1 mg/dL (ref 8.6–10.4)
Chloride: 98 mmol/L (ref 98–110)
Creat: 0.72 mg/dL (ref 0.50–0.99)
Glucose, Bld: 99 mg/dL (ref 65–99)
POTASSIUM: 3.8 mmol/L (ref 3.5–5.3)
SODIUM: 137 mmol/L (ref 135–146)

## 2015-10-20 MED ORDER — LOSARTAN POTASSIUM-HCTZ 100-25 MG PO TABS: 1.0000 | ORAL_TABLET | Freq: Every day | ORAL | Status: DC

## 2015-10-20 NOTE — Patient Instructions (Addendum)
[email or web address]-  Try to add stretching to your routine  We recommend that you schedule a mammogram for breast cancer screening. Typically, you do not need a referral to do this. Please contact a local imaging center to schedule your mammogram.  [facility] - [phone]  *ask for the Radiology Department [facility] - [phone] or [phone]  [facility] - [phone] [facility] - [phone] [facility] - [phone]  *ask for the Radiology Department [facility] - [phone]) [phone]  *ask for the Radiology Department [facility] - [phone]  *ask for the Mammography Department [facility] - [phone]) [phone]      IF you received an x-ray today, you will receive an invoice from [facility]. Please contact [facility] at [phone] with questions or concerns regarding your invoice.   IF you received labwork today, you will receive an invoice from [facility]. Please contact [facility] at [phone] with questions or concerns regarding your invoice.   Our billing staff will not be able to assist you with questions regarding bills from these companies.  You will be contacted with the lab results as soon as they are available. The fastest way to get your results is to activate your My Chart account. Instructions are located on the last page of this paperwork. If you have not heard from us regarding the results in 2 weeks, please contact this office.

## 2015-10-20 NOTE — Progress Notes (Signed)
   Subjective:    Patient ID: Victoria Hogan, female    DOB: 06/22/1950, 65 y.o.   MRN: [id number]  HPI This is a pleasant 65 yo female who presents today for follow up of HTN, GERD. She continues to watch her diet and is walking regularly. Tolerating medication well. She is a Runner, broadcasting/film/videoteacher at [employer]. She had her colonoscopy with Dr. Elnoria HowardHung. She had a small polyp removed and will have repeat in 5 years.   Past Medical History  Diagnosis Date  . Hypertension   . Pneumonia 02/2012  . GERD (gastroesophageal reflux disease)   . History of colonic diverticulitis   . Anxiety    Past Surgical History  Procedure Laterality Date  . Bunyonectomy    . Abdominal hysterectomy     Family History  Problem Relation Age of Onset  . Heart failure Father   . Arthritis Mother       Review of Systems No chest pain, no SOB, no edema. Energy level good.     Objective:   Physical Exam Physical Exam  Constitutional: Oriented to person, place, and time. She appears well-developed and well-nourished.  HENT:  Head: Normocephalic and atraumatic.  Eyes: Conjunctivae are normal.  Neck: Normal range of motion. Neck supple.  Cardiovascular: Normal rate, regular rhythm and normal heart sounds. No edema.   Pulmonary/Chest: Effort normal and breath sounds normal.  Musculoskeletal: Normal range of motion.  Neurological: Alert and oriented to person, place, and time.  Skin: Skin is warm and dry.  Psychiatric: Normal mood and affect. Behavior is normal. Judgment and thought content normal.  Vitals reviewed.     BP 134/82 mmHg  Pulse 73  Temp(Src) 98.1 F (36.7 C) (Oral)  Resp 16  Ht 5' (1.524 m)  Wt 158 lb 12.8 oz (72.031 kg)  BMI 31.01 kg/m2 Wt Readings from Last 3 Encounters:  10/20/15 158 lb 12.8 oz (72.031 kg)  02/09/15 164 lb (74.39 kg)  11/24/14 165 lb 6.4 oz (75.025 kg)       Assessment & Plan:  1. Essential hypertension - encouraged continued regular exercise, diet and weight  loss - Basic metabolic panel - losartan-hydrochlorothiazide (HYZAAR) 100-25 MG tablet; Take 1 tablet by mouth daily.  Dispense: 90 tablet; Refill: 1 - follow up in 6 months  Olean Reeeborah Amiliana Foutz, FNP-BC  [facility], [facility]  10/20/2015 1:18 PM

## 2015-10-22 ENCOUNTER — Other Ambulatory Visit: Payer: Self-pay | Admitting: Family Medicine

## 2015-10-22 ENCOUNTER — Ambulatory Visit (INDEPENDENT_AMBULATORY_CARE_PROVIDER_SITE_OTHER): Payer: BC Managed Care – PPO | Admitting: Family Medicine

## 2015-10-22 VITALS — BP 126/88 | HR 80 | Temp 98.0°F | Resp 18 | Ht 60.0 in | Wt 161.0 lb

## 2015-10-22 DIAGNOSIS — K0889 Other specified disorders of teeth and supporting structures: Secondary | ICD-10-CM

## 2015-10-22 MED ORDER — AMOXICILLIN 500 MG PO CAPS: 500.0000 mg | ORAL_CAPSULE | Freq: Three times a day (TID) | ORAL | Status: DC

## 2015-10-22 MED ORDER — HYDROCODONE-ACETAMINOPHEN 5-325 MG PO TABS: 1.0000 | ORAL_TABLET | Freq: Four times a day (QID) | ORAL | Status: DC | PRN

## 2015-10-22 NOTE — Progress Notes (Signed)
65 yo woman who developed pain overnight in tooth number 14.  The tooth is sensitive to tapping on it.  She had CPE two days ago  Objective:  Normal appearing number 14  BP 126/88 mmHg  Pulse 80  Temp(Src) 98 F (36.7 C) (Oral)  Resp 18  Ht 5' (1.524 m)  Wt 161 lb (73.029 kg)  BMI 31.44 kg/m2  SpO2 98% Assessment:  Dental pain  Plan: Toothache - Plan: amoxicillin (AMOXIL) 500 MG capsule, HYDROcodone-acetaminophen (NORCO) 5-325 MG tablet  Keep Monday dental appt, Elvina SidleKurt Crystalynn Mcinerney, MD

## 2015-10-22 NOTE — Patient Instructions (Addendum)
  Keep your Monday dental appointment   IF you received an x-ray today, you will receive an invoice from [facility]. Please contact [facility] at [phone] with questions or concerns regarding your invoice.   IF you received labwork today, you will receive an invoice from [facility]. Please contact [facility] at [phone] with questions or concerns regarding your invoice.   Our billing staff will not be able to assist you with questions regarding bills from these companies.  You will be contacted with the lab results as soon as they are available. The fastest way to get your results is to activate your My Chart account. Instructions are located on the last page of this paperwork. If you have not heard from us regarding the results in 2 weeks, please contact this office.

## 2015-11-04 ENCOUNTER — Ambulatory Visit (INDEPENDENT_AMBULATORY_CARE_PROVIDER_SITE_OTHER): Payer: BC Managed Care – PPO | Admitting: Medical

## 2015-11-04 ENCOUNTER — Encounter: Payer: Self-pay | Admitting: Family Medicine

## 2015-11-04 ENCOUNTER — Encounter: Payer: Self-pay | Admitting: Medical

## 2015-11-04 VITALS — BP 118/76 | HR 75 | Temp 98.2°F | Ht 60.0 in | Wt 157.0 lb

## 2015-11-04 DIAGNOSIS — F411 Generalized anxiety disorder: Secondary | ICD-10-CM

## 2015-11-04 DIAGNOSIS — R5383 Other fatigue: Secondary | ICD-10-CM

## 2015-11-04 DIAGNOSIS — K0889 Other specified disorders of teeth and supporting structures: Secondary | ICD-10-CM | POA: Diagnosis not present

## 2015-11-04 DIAGNOSIS — R7989 Other specified abnormal findings of blood chemistry: Secondary | ICD-10-CM | POA: Diagnosis not present

## 2015-11-04 MED ORDER — HYDROXYZINE HCL 10 MG PO TABS
ORAL_TABLET | ORAL | Status: DC
Start: 2015-11-04 — End: 2016-11-15

## 2015-11-04 MED ORDER — AMOXICILLIN 500 MG PO CAPS: 500.0000 mg | ORAL_CAPSULE | Freq: Three times a day (TID) | ORAL | Status: DC

## 2015-11-04 NOTE — Progress Notes (Signed)
Pre visit review using our clinic review tool, if applicable. No additional management support is needed unless otherwise documented below in the visit note. 

## 2015-11-04 NOTE — Progress Notes (Addendum)
Subjective:    Patient ID: Victoria Hogan, female    DOB: 11/04/1950, 65 y.o.   MRN: [id number]  HPI  Pt in feeling very tired for one week. She feels like she does not have energy(no history of anemia, thyroid or any kidney dysfunction). Pt mentioned history of low vit D in the past. She tried some otc vitamin D recently. Pt describes some hot and sweaty sensation.   Pt feels some anxious recently. She states feels jittery. Pt denies any chest pain or palpitations. Pt not tachycardic.  Pt just describes feeling not energy. I asked her if sob since this was on chief complaint. But on discussion and interview she does not report sob or wheezing.  Pt blood pressure 2 weeks ago were good.  [facility] Pt was given amoxicillin. Pt told by dentist infection under tooth. But she has not gone to dentist for followup. Pt was scheduled for root canal.        Review of Systems  Constitutional: Positive for fatigue. Negative for fever and chills.  HENT: Negative for congestion, ear discharge, ear pain, facial swelling, postnasal drip and rhinorrhea.        Teeth pain stopped about 12 days ago.   Gastrointestinal: Negative for abdominal pain.  Musculoskeletal: Negative for back pain.  Neurological: Negative for dizziness and headaches.  Hematological: Negative for adenopathy. Does not bruise/bleed easily.  Psychiatric/Behavioral: Negative for suicidal ideas, behavioral problems and confusion. The patient is nervous/anxious.    Past Medical History  Diagnosis Date  . Hypertension   . Pneumonia 02/2012  . GERD (gastroesophageal reflux disease)   . History of colonic diverticulitis   . Anxiety      Social History   Social History  . Marital Status: Divorced    Spouse Name: N/A  . Number of Children: N/A  . Years of Education: college   Occupational History  . [employer]   Social History Main Topics  . Smoking status: Never  Smoker   . Smokeless tobacco: Never Used  . Alcohol Use: No     Comment: occa  . Drug Use: No  . Sexual Activity: Not on file   Other Topics Concern  . Not on file   Social History Narrative   Bikes daily for exercise.    Past Surgical History  Procedure Laterality Date  . Bunyonectomy    . Abdominal hysterectomy      Family History  Problem Relation Age of Onset  . Heart failure Father   . Arthritis Mother     No Known Allergies  Current Outpatient Prescriptions on File Prior to Visit  Medication Sig Dispense Refill  . acetaminophen (TYLENOL) 500 MG tablet Take 500 mg by mouth every 6 (six) hours as needed. headache    . HYDROcodone-acetaminophen (NORCO) 5-325 MG tablet Take 1 tablet by mouth every 6 (six) hours as needed for moderate pain. 12 tablet 0  . losartan-hydrochlorothiazide (HYZAAR) 100-25 MG tablet Take 1 tablet by mouth daily. 90 tablet 1  . zoster vaccine live, PF, (ZOSTAVAX) [id number] UNT/0.65ML injection Inject 19,400 Units into the skin once. 1 each 0   No current facility-administered medications on file prior to visit.    BP 118/76 mmHg  Pulse 75  Temp(Src) 98.2 F (36.8 C) (Oral)  Ht 5' (1.524 m)  Wt 157 lb (71.215 kg)  BMI 30.66 kg/m2  SpO2 98%       Objective:  Physical Exam  General Mental Status- Alert. General Appearance- Not in acute distress.    HEENT Head- Normal. Ear Auditory Canal - Left- Normal. Right - Normal.Tympanic Membrane- Left- Normal. Right- Normal. Eye Sclera/Conjunctiva- Left- Normal. Right- Normal. Nose & Sinuses Nasal Mucosa- Left-  Boggy and Congested. Right-  Boggy and  Congested.Bilateral no  maxillary and  No frontal sinus pressure. Mouth & Throat Lips: Upper Lip- Normal: no dryness, cracking, pallor, cyanosis, or vesicular eruption. Lower Lip-Normal: no dryness, cracking, pallor, cyanosis or vesicular eruption. Buccal Mucosa- Bilateral- No Aphthous ulcers. Oropharynx- No Discharge or Erythema. Tonsils:  Characteristics- Bilateral- No Erythema or Congestion. Size/Enlargement- Bilateral- No enlargement. Discharge- bilateral-None. Mouth- no obvious cavity. Tooth number 14 not tender presently,    Skin General: Color- Normal Color. Moisture- Normal Moisture.  Neck Carotid Arteries- Normal color. Moisture- Normal Moisture. No carotid bruits. No JVD.  Chest and Lung Exam Auscultation: Breath Sounds:-Normal.  Cardiovascular Auscultation:Rythm- Regular. Murmurs & Other Heart Sounds:Auscultation of the heart reveals- No Murmurs.  Abdomen Inspection:-Inspeection Normal. Palpation/Percussion:Note:No mass. Palpation and Percussion of the abdomen reveal- Non Tender, Non Distended + BS, no rebound or guarding.   Neurologic Cranial Nerve exam:- CN III-XII intact(No nystagmus), symmetric smile. Strength:- 5/5 equal and symmetric strength both upper and lower extremities.  Lower ext- no pedal edema. Negative homans signs.      Assessment & Plan:  For your history of fatigue will get cbc, cmp, tsh, and b12 level.  For history of low vit d will get level today.  I do think it would be best for you to see dentis asap to solve you tooth problem. I could write amoxicillin today. Sometime low level infection can effect energy level.  You mentioned some anxiety and in past had been on hydroxyzine. I will rx low dose. But be aware could sedate you so only giving low dose to use at night.  Follow up in 2 wks or as needed  Verniece Encarnacion, Ramon DredgeEdward, [employer]-C

## 2015-11-04 NOTE — Patient Instructions (Addendum)
For your history of fatigue will get cbc, cmp, tsh, and b12 level.  For history of low vit d will get level today.  I do think it would be best for you to see dentis asap to solve you tooth problem. I could write amoxicillin today. Sometime low level infection can effect energy level.  You mentioned some anxiety and in past had been on hydroxyzine. I will rx low dose. But be aware could sedate you so only giving low dose to use at night.  Follow up in 2 wks or as needed

## 2015-11-05 LAB — CBC WITH DIFFERENTIAL/PLATELET
BASOS PCT: 0.3 % (ref 0.0–3.0)
Basophils Absolute: 0 10*3/uL (ref 0.0–0.1)
EOS PCT: 1.6 % (ref 0.0–5.0)
Eosinophils Absolute: 0.1 10*3/uL (ref 0.0–0.7)
HEMATOCRIT: 40.8 % (ref 36.0–46.0)
HEMOGLOBIN: 13.9 g/dL (ref 12.0–15.0)
LYMPHS PCT: 34 % (ref 12.0–46.0)
Lymphs Abs: 1.9 10*3/uL (ref 0.7–4.0)
MCHC: 34 g/dL (ref 30.0–36.0)
MCV: 91 fl (ref 78.0–100.0)
Monocytes Absolute: 0.6 10*3/uL (ref 0.1–1.0)
Monocytes Relative: 11 % (ref 3.0–12.0)
NEUTROS ABS: 2.9 10*3/uL (ref 1.4–7.7)
Neutrophils Relative %: 53.1 % (ref 43.0–77.0)
Platelets: 314 10*3/uL (ref 150.0–400.0)
RBC: 4.49 Mil/uL (ref 3.87–5.11)
RDW: 11.9 % (ref 11.5–15.5)
WBC: 5.5 10*3/uL (ref 4.0–10.5)

## 2015-11-05 LAB — TSH: TSH: 1.87 u[IU]/mL (ref 0.35–4.50)

## 2015-11-05 LAB — COMPREHENSIVE METABOLIC PANEL
ALBUMIN: 4.4 g/dL (ref 3.5–5.2)
ALT: 25 U/L (ref 0–35)
AST: 21 U/L (ref 0–37)
Alkaline Phosphatase: 64 U/L (ref 39–117)
BUN: 13 mg/dL (ref 6–23)
CALCIUM: 9.9 mg/dL (ref 8.4–10.5)
CHLORIDE: 102 meq/L (ref 96–112)
CO2: 31 mEq/L (ref 19–32)
CREATININE: 0.69 mg/dL (ref 0.40–1.20)
GFR: 109.76 mL/min (ref >60.00)
Glucose, Bld: 102 mg/dL — ABNORMAL HIGH (ref 70–99)
POTASSIUM: 4.4 meq/L (ref 3.5–5.1)
Sodium: 139 mEq/L (ref 135–145)
Total Bilirubin: 0.4 mg/dL (ref 0.2–1.2)
Total Protein: 7.9 g/dL (ref 6.0–8.3)

## 2015-11-05 LAB — VITAMIN D 25 HYDROXY (VIT D DEFICIENCY, FRACTURES): VITD: 22.06 ng/mL — AB (ref 30.00–100.00)

## 2015-11-05 LAB — VITAMIN B12: VITAMIN B 12: 446 pg/mL (ref 211–911)

## 2015-11-05 MED ORDER — VITAMIN D (ERGOCALCIFEROL) 1.25 MG (50000 UNIT) PO CAPS: 50000.0000 [IU] | ORAL_CAPSULE | ORAL | Status: DC

## 2015-11-05 NOTE — Telephone Encounter (Signed)
rx vitamin D sent to her pharmacy.

## 2015-11-05 NOTE — Progress Notes (Signed)
Quick Note:  Pt has seen results on MyChart and message also sent for patient to call back if any questions. ______ 

## 2015-11-27 ENCOUNTER — Telehealth: Payer: Self-pay

## 2015-11-30 ENCOUNTER — Encounter: Payer: Self-pay | Admitting: Family Medicine

## 2015-11-30 ENCOUNTER — Ambulatory Visit (INDEPENDENT_AMBULATORY_CARE_PROVIDER_SITE_OTHER): Payer: BC Managed Care – PPO | Admitting: Family Medicine

## 2015-11-30 VITALS — BP 132/79 | HR 86 | Temp 98.7°F | Ht 60.0 in | Wt 150.2 lb

## 2015-11-30 DIAGNOSIS — R5382 Chronic fatigue, unspecified: Secondary | ICD-10-CM | POA: Diagnosis not present

## 2015-11-30 DIAGNOSIS — R42 Dizziness and giddiness: Secondary | ICD-10-CM

## 2015-11-30 DIAGNOSIS — R1084 Generalized abdominal pain: Secondary | ICD-10-CM | POA: Diagnosis not present

## 2015-11-30 NOTE — Patient Instructions (Signed)
So far I do not see any cause for your fatigue symptoms. However we will screen you for lyme disease and also make sure that there is no sign of an ovarian cancer.    I will be in touch with your labs asap Please keep a basic journal of your symptoms and energy level

## 2015-11-30 NOTE — Progress Notes (Signed)
[facility] at [facility] [address] [place], [place] [address] [phone]  Date:  11/30/2015   Name:  Victoria Hogan   DOB:  Nov 03, 1950   MRN:  [id number]  PCP:  Abbe Amsterdam, MD    Chief Complaint: Establish Care (Pt was seen 11/04/15 for fatigue pt states that some days the fatigue is worse. Also still having severe stomach especially in the mornings. )   History of Present Illness:  Victoria Hogan is a 65 y.o. very pleasant female patient who presents with the following:  Here today to follow-up.  Seen here about one month ago with fatigue which she has noticed since early June- seemed to start after she had a colonoscopy.  Her colonoscopy was ok, just a benign polyp or two per her report.   All recent labs looked ok except her vitamin d was low.  She has started taking 50k vitamin D weekly  She also has noted stomach cramps- these occur nearly every day in the morning. She has noted this for years, and has seen Dr. Elnoria Howard.   She does have a history of diverticulitis in the past but not currently  She is s/p hysterectomy.   She states that she did feel this way a few years ago; the dx was never clear at that time. She was treated with several rounds of abx and her sx eventually went away  She states that she will get "dizzy and lightheaded" really anytime she tries to stand up or do any activity.    She had been on losartan for her HTN- over the last 3 weeks she stopped taking it as it seemed to worsen her sx.   She feels like she has lost about 8lbs without trying to lose She believes but is not certain that her ovaries were removed with her hysterectomy. Ovaries not seen on her CT abd in 2013  She does not feel like she has been under any abnormal stressors.  She does not feel like she is depressed except for how she has felt ill  No vomiting, no fever, sleeping well, no rashes, no neurological sx, she is eating normally    Wt Readings from Last 3 Encounters:  11/30/15 150 lb 3.2 oz (68.1 kg)  11/04/15 157 lb (71.2 kg)  10/22/15 161 lb (73 kg)     BP Readings from Last 3 Encounters:  11/30/15 132/79  11/04/15 118/76  10/22/15 126/88     Patient Active Problem List   Diagnosis Date Noted  . HTN, goal below 150/90 02/23/2014  . Hypertension 04/08/2012  . Diverticulitis large intestine 03/23/2012  . Vitamin D deficiency 03/14/2012  . Heart murmur 03/06/2012    Past Medical History:  Diagnosis Date  . Anxiety   . GERD (gastroesophageal reflux disease)   . History of colonic diverticulitis   . Hypertension   . Pneumonia 02/2012    Past Surgical History:  Procedure Laterality Date  . ABDOMINAL HYSTERECTOMY    . bunyonectomy      Social History  Substance Use Topics  . Smoking status: Never Smoker  . Smokeless tobacco: Never Used  . Alcohol use No     Comment: occa    Family History  Problem Relation Age of Onset  . Heart failure Father   . Arthritis Mother     No Known Allergies  Medication list has been reviewed and updated.  Current Outpatient Prescriptions on File Prior  to Visit  Medication Sig Dispense Refill  . acetaminophen (TYLENOL) 500 MG tablet Take 500 mg by mouth every 6 (six) hours as needed. headache    . HYDROcodone-acetaminophen (NORCO) 5-325 MG tablet Take 1 tablet by mouth every 6 (six) hours as needed for moderate pain. 12 tablet 0  . hydrOXYzine (ATARAX/VISTARIL) 10 MG tablet 1 tab po q hs for anxiety or insomnia 14 tablet 0  . losartan-hydrochlorothiazide (HYZAAR) 100-25 MG tablet Take 1 tablet by mouth daily. 90 tablet 1  . Vitamin D, Ergocalciferol, (DRISDOL) 50000 units CAPS capsule Take 1 capsule (50,000 Units total) by mouth every 7 (seven) days. 8 capsule 0  . zoster vaccine live, PF, (ZOSTAVAX) 69629 UNT/0.65ML injection Inject 19,400 Units into the skin once. 1 each 0   No current facility-administered medications on file prior to visit.      Review of Systems:  As per HPI- otherwise negative.   Physical Examination: Vitals:   11/30/15 1410 11/30/15 1415  BP: (!) 152/88 132/79  Pulse: 86   Temp: 98.7 F (37.1 C)    Vitals:   11/30/15 1410  Weight: 150 lb 3.2 oz (68.1 kg)  Height: 5' (1.524 m)   Body mass index is 29.33 kg/m. Ideal Body Weight: Weight in (lb) to have BMI = 25: 127.7  GEN: WDWN, NAD, Non-toxic, A & O x 3, mild overweight, looks well HEENT: Atraumatic, Normocephalic. Neck supple. No masses, No LAD. Ears and Nose: No external deformity. CV: RRR, No M/G/R. No JVD. No thrill. No extra heart sounds. PULM: CTA B, no wheezes, crackles, rhonchi. No retractions. No resp. distress. No accessory muscle use. ABD: S, NT, ND. No rebound. No HSM. EXTR: No c/c/e NEURO Normal gait.  PSYCH: Normally interactive. Conversant. Not depressed or anxious appearing.  Calm demeanor.   EKG:NSR, no ST elevation or depression  Orthostatic VS for the past 24 hrs:  BP- Lying Pulse- Lying BP- Sitting Pulse- Sitting BP- Standing at 0 minutes Pulse- Standing at 0 minutes  11/30/15 1509 134/81 73 148/81 75 140/82 82    Assessment and Plan: Dizziness and giddiness - Plan: EKG 12-Lead  Generalized abdominal pain - Plan: CA 125  Chronic fatigue - Plan: Lyme Disease Abs IgG, IgM, IFA, CSF  Here today with fatigue- so far lab w/u has been unrevealing. No evidence of orthostatic hypotension or arrhythmia, EKG is normal  She does have chronic GI symptoms but this is not new and she is already under GI care.   She had a hyst but is not totally sure that her ovaries were removed so will check Ca-125, will also do lyme titers.   She feels reassured that her w/u so far has been negative Will plan further follow- up pending labs.   Signed Abbe Amsterdam, MD

## 2015-11-30 NOTE — Progress Notes (Signed)
Pre visit review using our clinic review tool, if applicable. No additional management support is needed unless otherwise documented below in the visit note. 

## 2015-12-01 LAB — CA 125: CA 125: 9 U/mL (ref <35)

## 2015-12-01 LAB — LYME AB/WESTERN BLOT REFLEX

## 2015-12-23 ENCOUNTER — Encounter: Payer: Self-pay | Admitting: Family Medicine

## 2015-12-23 ENCOUNTER — Other Ambulatory Visit: Payer: Self-pay | Admitting: Medical

## 2016-01-01 ENCOUNTER — Other Ambulatory Visit: Payer: Self-pay | Admitting: Family Medicine

## 2016-01-01 MED ORDER — VITAMIN D (ERGOCALCIFEROL) 1.25 MG (50000 UNIT) PO CAPS: 50000.0000 [IU] | ORAL_CAPSULE | ORAL | 0 refills | Status: DC

## 2016-01-27 ENCOUNTER — Ambulatory Visit: Payer: BC Managed Care – PPO | Admitting: Family Medicine

## 2016-04-20 ENCOUNTER — Other Ambulatory Visit: Payer: Self-pay | Admitting: Emergency Medicine

## 2016-04-20 ENCOUNTER — Other Ambulatory Visit: Payer: Self-pay | Admitting: Family Medicine

## 2016-04-20 DIAGNOSIS — I10 Essential (primary) hypertension: Secondary | ICD-10-CM

## 2016-04-20 MED ORDER — LOSARTAN POTASSIUM-HCTZ 100-25 MG PO TABS: 1.0000 | ORAL_TABLET | Freq: Every day | ORAL | 1 refills | Status: DC

## 2016-08-22 ENCOUNTER — Ambulatory Visit (INDEPENDENT_AMBULATORY_CARE_PROVIDER_SITE_OTHER): Payer: Medicare Other | Admitting: Family Medicine

## 2016-08-22 ENCOUNTER — Encounter: Payer: Self-pay | Admitting: Family Medicine

## 2016-08-22 VITALS — BP 140/72 | HR 72 | Temp 98.1°F | Resp 16 | Ht 61.0 in | Wt 174.0 lb

## 2016-08-22 DIAGNOSIS — Z1159 Encounter for screening for other viral diseases: Secondary | ICD-10-CM | POA: Diagnosis not present

## 2016-08-22 DIAGNOSIS — E663 Overweight: Secondary | ICD-10-CM

## 2016-08-22 DIAGNOSIS — R635 Abnormal weight gain: Secondary | ICD-10-CM

## 2016-08-22 DIAGNOSIS — E559 Vitamin D deficiency, unspecified: Secondary | ICD-10-CM

## 2016-08-22 DIAGNOSIS — Z23 Encounter for immunization: Secondary | ICD-10-CM

## 2016-08-22 DIAGNOSIS — Z130 Encounter for screening for diseases of the blood and blood-forming organs and certain disorders involving the immune mechanism: Secondary | ICD-10-CM | POA: Diagnosis not present

## 2016-08-22 DIAGNOSIS — Z131 Encounter for screening for diabetes mellitus: Secondary | ICD-10-CM

## 2016-08-22 DIAGNOSIS — Z0000 Encounter for general adult medical examination without abnormal findings: Secondary | ICD-10-CM | POA: Diagnosis not present

## 2016-08-22 DIAGNOSIS — Z1239 Encounter for other screening for malignant neoplasm of breast: Secondary | ICD-10-CM

## 2016-08-22 DIAGNOSIS — Z1231 Encounter for screening mammogram for malignant neoplasm of breast: Secondary | ICD-10-CM | POA: Diagnosis not present

## 2016-08-22 LAB — COMPREHENSIVE METABOLIC PANEL
ALT: 19 U/L (ref 0–35)
AST: 21 U/L (ref 0–37)
Albumin: 4.6 g/dL (ref 3.5–5.2)
Alkaline Phosphatase: 70 U/L (ref 39–117)
BILIRUBIN TOTAL: 0.6 mg/dL (ref 0.2–1.2)
BUN: 12 mg/dL (ref 6–23)
CHLORIDE: 101 meq/L (ref 96–112)
CO2: 29 meq/L (ref 19–32)
Calcium: 10 mg/dL (ref 8.4–10.5)
Creatinine, Ser: 0.74 mg/dL (ref 0.40–1.20)
GFR: 100.99 mL/min (ref >60.00)
GLUCOSE: 99 mg/dL (ref 70–99)
POTASSIUM: 4.4 meq/L (ref 3.5–5.1)
SODIUM: 137 meq/L (ref 135–145)
Total Protein: 7.8 g/dL (ref 6.0–8.3)

## 2016-08-22 LAB — LIPID PANEL
CHOL/HDL RATIO: 3
Cholesterol: 242 mg/dL — ABNORMAL HIGH (ref 0–200)
HDL: 72.3 mg/dL (ref >39.00)
LDL Cholesterol: 154 mg/dL — ABNORMAL HIGH (ref 0–99)
NonHDL: 169.2
Triglycerides: 77 mg/dL (ref 0.0–149.0)
VLDL: 15.4 mg/dL (ref 0.0–40.0)

## 2016-08-22 LAB — CBC
HCT: 40.9 % (ref 36.0–46.0)
Hemoglobin: 13.9 g/dL (ref 12.0–15.0)
MCHC: 33.9 g/dL (ref 30.0–36.0)
MCV: 93.3 fl (ref 78.0–100.0)
Platelets: 317 10*3/uL (ref 150.0–400.0)
RBC: 4.38 Mil/uL (ref 3.87–5.11)
RDW: 12.3 % (ref 11.5–15.5)
WBC: 5.4 10*3/uL (ref 4.0–10.5)

## 2016-08-22 LAB — HEMOGLOBIN A1C: Hgb A1c MFr Bld: 5.7 % (ref 4.6–6.5)

## 2016-08-22 LAB — TSH: TSH: 2.7 u[IU]/mL (ref 0.35–4.50)

## 2016-08-22 NOTE — Patient Instructions (Addendum)
It was very nice to see you today! Take care and I will be in touch with your labs asap  Please do work on gradual weight loss- approx 1 lb a week is a good goal.  Make small diet changes and be more active on a daily basis  Please do get a mammogram soon!  This is really important given your family history and hopefully will set your mind at ease   Please call your insurance company and ask about the Shingrix vaccine ?how much will it cost for you.  We are glad to give this to you at your convenience  If you have not already, get an annual eye exam and glaucoma screening

## 2016-08-22 NOTE — Progress Notes (Signed)
Pre visit review using our clinic review tool, if applicable. No additional management support is needed unless otherwise documented below in the visit note. 

## 2016-08-22 NOTE — Progress Notes (Addendum)
Subjective:    Victoria Hogan is a 66 y.o. female who presents for a Welcome to Medicare exam.   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  This has been a adjustment, but she is getting used to this. She is working on getting into a new routine and on keeping herself busy- and also needs to work on not eating too much!  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   She has gained some weight but hopes to get this back under control. She thinks this is part of her adjustment to retirmenet Admits that she has been eating too much  She is on BP medication She is s/p hysterectomy- never had any type of GYN cancer Her last mammogram was a few years ago- she has felt scared to do this although she knows that it needs to be done  Her sister did have breast cancer.   Never had a bone density exam  Would like a pneumonia booster today.    Wt Readings from Last 3 Encounters:  08/22/16 174 lb (78.9 kg)  11/30/15 150 lb 3.2 oz (68.1 kg)  11/04/15 157 lb (71.2 kg)         Objective:    Today's Vitals   08/22/16 1242  BP: 140/72  Pulse: 72  Resp: 16  Temp: 98.1 F (36.7 C)  TempSrc: Oral  SpO2: 98%  Weight: 174 lb (78.9 kg)  Height:  (1.549 m)  Body mass index is 32.88 kg/m.  Medications Outpatient Encounter Prescriptions as of 08/22/2016  Medication Sig  . acetaminophen (TYLENOL) 500 MG tablet Take 500 mg by mouth every 6 (six) hours as needed. headache  . hydrOXYzine (ATARAX/VISTARIL) 10 MG tablet 1 tab po q hs for anxiety or insomnia  . losartan-hydrochlorothiazide (HYZAAR) 100-25 MG tablet Take 1 tablet by mouth daily.  Marland Kitchen HYDROcodone-acetaminophen (NORCO) 5-325 MG tablet Take 1 tablet by mouth  every 6 (six) hours as needed for moderate pain. (Patient not taking: Reported on 08/22/2016)  . nitroGLYCERIN (NITROGLYN) 2 % ointment Apply topically 4 (four) times daily.  . Vitamin D, Ergocalciferol, (DRISDOL) 50000 units CAPS capsule Take 1 capsule (50,000 Units total) by mouth every 7 (seven) days. (Patient not taking: Reported on 08/22/2016)  . [DISCONTINUED] zoster vaccine live, PF, (ZOSTAVAX) 65784 UNT/0.65ML injection Inject 19,400 Units into the skin once. (Patient not taking: Reported on 08/22/2016)   No facility-administered encounter medications on file as of 08/22/2016.      History: Past Medical History:  Diagnosis Date  . Anxiety   . GERD (gastroesophageal reflux disease)   . History of colonic diverticulitis   . Hypertension   . Pneumonia 02/2012   Past Surgical History:  Procedure Laterality Date  . ABDOMINAL HYSTERECTOMY    . bunyonectomy      Family History  Problem Relation Age of Onset  . Heart failure Father   . Arthritis Mother    Social History   Occupational History  . Teacher [employer]   Social History Main Topics  . Smoking status: Never Smoker  . Smokeless tobacco: Never Used  . Alcohol use No     Comment: occa  . Drug use: No  .  Sexual activity: Not on file    Tobacco Counseling Counseling given: Not Answered   Immunizations and Health Maintenance Immunization History  Administered Date(s) Administered  . Influenza Split 03/07/2012  . Influenza,inj,Quad PF,36+ Mos 04/10/2013, 02/21/2014, 02/09/2015  . Pneumococcal Conjugate-13 08/22/2016  . Pneumococcal Polysaccharide-23 03/07/2012  . Tdap 12/05/2012   Health Maintenance Due  Topic Date Due  . Hepatitis C Screening  1950/10/15  . PAP SMEAR  09/07/1971  . MAMMOGRAM  09/06/2000  . DEXA SCAN  09/07/2015  . PNA vac Low Risk Adult (1 of 2 - PCV13) 09/07/2015    Activities of Daily Living No flowsheet data found.  Physical Exam  (optional), or other factors deemed  appropriate based on the beneficiary's medical and social history and current clinical standards.  Advanced Directives:      Assessment:    This is a routine wellness examination for this patient .   Vision/Hearing screen See screening tab.     Dietary issues and exercise activities discussed:  She plans to get more exercise and eat better.  She would like to lose about 15 lbs    Goals    None     Depression Screen PHQ 2/9 Scores 10/22/2015 10/20/2015 02/09/2015 11/24/2014  PHQ - 2 Score 0 0 0 3  PHQ- 9 Score - - - 11    She admits that she may feel moody some of the time, but does not feel depressed  Fall Risk Fall Risk  10/22/2015  Falls in the past year? No  Number falls in past yr: -  Injury with Fall? -   No falls  Cognitive Function:    no concerns, she has not noted any difficutly with her memory or cognition.  She is independent in all ADLs  Vitals:   08/22/16 1242  BP: 140/72  Pulse: 72  Resp: 16  Temp: 98.1 F (36.7 C)   GEN: WDWN, NAD, Non-toxic, A & O x 3, overweight, otherwise looks well HEENT: Atraumatic, Normocephalic. Neck supple. No masses, No LAD.  Bilateral TM wnl, oropharynx normal.  PEERL,EOMI.   Ears and Nose: No external deformity. CV: RRR, No M/G/R. No JVD. No thrill. No extra heart sounds. PULM: CTA B, no wheezes, crackles, rhonchi. No retractions. No resp. distress. No accessory muscle use. ABD: S, NT, ND. No rebound. No HSM. EXTR: No c/c/e NEURO Normal gait.  PSYCH: Normally interactive. Conversant. Not depressed or anxious appearing.  Calm demeanor.      Patient Care Team: Pearline Cables, MD as PCP - General (Family Medicine) Jeani Hawking, MD as Attending Physician (Gastroenterology)     Plan:    Vitamin D deficiency - Plan: DG BONE DENSITY (DXA), CANCELED: HM DEXA SCAN  Screening for breast cancer - Plan: MM Digital Screening  Immunization due - Plan: Pneumococcal conjugate vaccine 13-valent IM  Weight gain - Plan:  Lipid panel, TSH  Encounter for hepatitis C screening test for low risk patient - Plan: Hepatitis C antibody  Screening for diabetes mellitus - Plan: Comprehensive metabolic panel, Hemoglobin A1c  Screening for deficiency anemia - Plan: CBC  Labs pending as above She plans to get a mammogram asap Will plan further follow- up pending labs.  During the course of the visit the patient was educated and counseled about the following appropriate screening and preventive services:   Vaccines to include Pneumoccal, Influenza, Hepatitis B, Td, Zostavax, HCV  Electrocardiogram  Cardiovascular Disease  Colorectal cancer screening  Bone density screening  Diabetes screening  Glaucoma screening  Mammography/PAP  Nutrition counseling  Her current medications and allergies were reviewed and needed refills of her chronic medications were ordered. The plan for yearly health maintenance was discussed and all orders and referrals were made as appropriate.  Patient Instructions (the written plan) was given to the patient.   Chamya Hunton, MD 08/22/2016  Received her labs 4/17, message to pt  Results for orders placed or performed in visit on 08/22/16  Hepatitis C antibody  Result Value Ref Range   HCV Ab NEGATIVE NEGATIVE  CBC  Result Value Ref Range   WBC 5.4 4.0 - 10.5 K/uL   RBC 4.38 3.87 - 5.11 Mil/uL   Platelets 317.0 150.0 - 400.0 K/uL   Hemoglobin 13.9 12.0 - 15.0 g/dL   HCT 21.3 08.6 - 57.8 %   MCV 93.3 78.0 - 100.0 fl   MCHC 33.9 30.0 - 36.0 g/dL   RDW 46.9 62.9 - 52.8 %  Comprehensive metabolic panel  Result Value Ref Range   Sodium 137 135 - 145 mEq/L   Potassium 4.4 3.5 - 5.1 mEq/L   Chloride 101 96 - 112 mEq/L   CO2 29 19 - 32 mEq/L   Glucose, Bld 99 70 - 99 mg/dL   BUN 12 6 - 23 mg/dL   Creatinine, Ser 4.13 0.40 - 1.20 mg/dL   Total Bilirubin 0.6 0.2 - 1.2 mg/dL   Alkaline Phosphatase 70 39 - 117 U/L   AST 21 0 - 37 U/L   ALT 19 0 - 35 U/L   Total Protein  7.8 6.0 - 8.3 g/dL   Albumin 4.6 3.5 - 5.2 g/dL   Calcium 24.4 8.4 - 01.0 mg/dL   GFR 272.53 >66.44 mL/min  Lipid panel  Result Value Ref Range   Cholesterol 242 (H) 0 - 200 mg/dL   Triglycerides 03.4 0.0 - 149.0 mg/dL   HDL 74.25 >95.63 mg/dL   VLDL 87.5 0.0 - 64.3 mg/dL   LDL Cholesterol 329 (H) 0 - 99 mg/dL   Total CHOL/HDL Ratio 3    NonHDL 169.20   TSH  Result Value Ref Range   TSH 2.70 0.35 - 4.50 uIU/mL  Hemoglobin A1c  Result Value Ref Range   Hgb A1c MFr Bld 5.7 4.6 - 6.5 %

## 2016-08-23 ENCOUNTER — Encounter: Payer: Self-pay | Admitting: Family Medicine

## 2016-08-23 LAB — HEPATITIS C ANTIBODY: HCV AB: NEGATIVE

## 2016-08-29 ENCOUNTER — Encounter (HOSPITAL_BASED_OUTPATIENT_CLINIC_OR_DEPARTMENT_OTHER): Payer: Self-pay

## 2016-08-29 ENCOUNTER — Ambulatory Visit (HOSPITAL_BASED_OUTPATIENT_CLINIC_OR_DEPARTMENT_OTHER)
Admission: RE | Admit: 2016-08-29 | Discharge: 2016-08-29 | Disposition: A | Discharge status: Home / Self Care | Payer: Medicare Other | Source: Ambulatory Visit | Attending: Family Medicine | Admitting: Family Medicine

## 2016-08-29 DIAGNOSIS — E559 Vitamin D deficiency, unspecified: Secondary | ICD-10-CM | POA: Insufficient documentation

## 2016-08-29 DIAGNOSIS — M85851 Other specified disorders of bone density and structure, right thigh: Secondary | ICD-10-CM | POA: Diagnosis not present

## 2016-08-29 DIAGNOSIS — Z1231 Encounter for screening mammogram for malignant neoplasm of breast: Secondary | ICD-10-CM | POA: Diagnosis present

## 2016-08-29 DIAGNOSIS — Z1239 Encounter for other screening for malignant neoplasm of breast: Secondary | ICD-10-CM

## 2016-08-30 ENCOUNTER — Encounter: Payer: Self-pay | Admitting: Family Medicine

## 2016-08-30 ENCOUNTER — Other Ambulatory Visit: Payer: Self-pay | Admitting: Family Medicine

## 2016-08-30 DIAGNOSIS — E559 Vitamin D deficiency, unspecified: Secondary | ICD-10-CM

## 2016-08-30 DIAGNOSIS — M810 Age-related osteoporosis without current pathological fracture: Secondary | ICD-10-CM | POA: Insufficient documentation

## 2016-08-30 DIAGNOSIS — M8580 Other specified disorders of bone density and structure, unspecified site: Secondary | ICD-10-CM

## 2016-09-02 NOTE — Telephone Encounter (Signed)
Done

## 2016-09-15 ENCOUNTER — Encounter: Payer: Self-pay | Admitting: Family Medicine

## 2016-09-15 ENCOUNTER — Other Ambulatory Visit (INDEPENDENT_AMBULATORY_CARE_PROVIDER_SITE_OTHER): Payer: Medicare Other

## 2016-09-15 ENCOUNTER — Telehealth: Payer: Self-pay | Admitting: Family Medicine

## 2016-09-15 DIAGNOSIS — E559 Vitamin D deficiency, unspecified: Secondary | ICD-10-CM

## 2016-09-15 DIAGNOSIS — M8580 Other specified disorders of bone density and structure, unspecified site: Secondary | ICD-10-CM | POA: Diagnosis not present

## 2016-09-15 LAB — VITAMIN D 25 HYDROXY (VIT D DEFICIENCY, FRACTURES): VITD: 20.55 ng/mL — ABNORMAL LOW (ref 30.00–100.00)

## 2016-09-15 MED ORDER — VITAMIN D (ERGOCALCIFEROL) 1.25 MG (50000 UNIT) PO CAPS: 50000.0000 [IU] | ORAL_CAPSULE | ORAL | 0 refills | Status: DC

## 2016-09-15 NOTE — Telephone Encounter (Signed)
Treat persistent vitamin D def

## 2016-10-22 ENCOUNTER — Other Ambulatory Visit: Payer: Self-pay | Admitting: Family Medicine

## 2016-10-22 DIAGNOSIS — I10 Essential (primary) hypertension: Secondary | ICD-10-CM

## 2016-11-02 ENCOUNTER — Ambulatory Visit: Payer: Medicare Other | Admitting: Family Medicine

## 2016-11-13 NOTE — Progress Notes (Signed)
[facility] at [facility] [address] [place], [place] [address] [phone]  Date:  11/14/2016   Name:  Victoria Hogan   DOB:  May 07, 1951   MRN:  [id number]  PCP:  Pearline Cables, MD    Chief Complaint: Fatigue (c/o feeling weak, heart racing, a little light headed. Pt states that sx's come and go. Pt just wants to make sure everythingis ok before she goes out of town next week.  Recently started Vit D. Pt stopped drinking alcohol and coffee 10 days and is not sure if this may be causing the sx's. )   History of Present Illness:  Victoria Hogan is a 66 y.o. very pleasant female patient who presents with the following:  History of hypertension, diverticulitis, overweight.  She did get her mammo and bone density done.   Here today with concern of heart palpitations  About 10 days ago she was having palpitations while in church, she went to an UC clinic but they were not able to get an EKG as their machine was broken.  They suggested that she go to the ER but she did not think it was probably necessary to go.  She continues to have intermittent palpitations and feeling of her heart racing over the last 10 days.  Then on 7/4 she went to a different UC for the same symptoms- however they just took her vitals and she left, she was not seen.   She may have the palpitations about 3x a day- they may last 10- 30 seconds No CP  When she has the sx she will feel SOB, otherwise no breathing sx.   Never had any CAD or other heart issues- she was admitted in 2013 after a syncopal episode. This was thought due to over treatment of her BP/ too much diuretic.  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  This has been a adjustment, but she is getting used to this. She is working on getting into a new routine and on keeping herself busy- and also needs to work on not eating too much!  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   She has gained some weight but hopes to get this back under control. She thinks this is part of her adjustment to retirmenet Admits that she has been eating too much  She is on BP medication She is s/p hysterectomy- never had any type of GYN cancer Her last mammogram was a few years ago- she has felt scared to do this although she knows that it needs to be done  Her sister did have breast cancer.   Never had a bone density exam  We did start her on vitamin D in May as her level was low.   BP Readings from Last 3 Encounters:  11/14/16  135/79  08/22/16 140/72  11/30/15 132/79   Pulse Readings from Last 3 Encounters:  11/14/16 96  08/22/16 72  11/30/15 86     Patient Active Problem List   Diagnosis Date Noted  . Osteopenia 08/30/2016  . Overweight 08/22/2016  . HTN, goal below 150/90 02/23/2014  . Hypertension 04/08/2012  . Diverticulitis large intestine 03/23/2012  . Vitamin D deficiency 03/14/2012  . Heart murmur 03/06/2012    Past Medical History:  Diagnosis Date  . Anxiety   . GERD (gastroesophageal reflux disease)   . History of colonic diverticulitis   . Hypertension   . Pneumonia 02/2012    Past Surgical History:  Procedure Laterality Date  . ABDOMINAL HYSTERECTOMY    . bunyonectomy      Social History  Substance Use Topics  . Smoking status: Never Smoker  . Smokeless tobacco: Never Used  . Alcohol use No     Comment: occa     Family History  Problem Relation Age of Onset  . Heart failure Father   . Arthritis Mother     No Known Allergies  Medication list has been reviewed and updated.  Current Outpatient Prescriptions on File Prior to Visit  Medication Sig Dispense Refill  . acetaminophen (TYLENOL) 500 MG tablet Take 500 mg by mouth every 6 (six) hours as needed. headache    . HYDROcodone-acetaminophen (NORCO) 5-325 MG tablet Take 1 tablet by mouth every 6 (six) hours as needed for moderate pain. 12 tablet 0  . hydrOXYzine (ATARAX/VISTARIL) 10 MG tablet 1 tab po q hs for anxiety or insomnia 14 tablet 0  . losartan-hydrochlorothiazide (HYZAAR) 100-25 MG tablet TAKE 1 TABLET BY MOUTH DAILY. 90 tablet 1  . nitroGLYCERIN (NITROGLYN) 2 % ointment Apply topically 4 (four) times daily.    . Vitamin D, Ergocalciferol, (DRISDOL) 50000 units CAPS capsule Take 1 capsule (50,000 Units total) by mouth every 7 (seven) days. 12 capsule 0   No current facility-administered medications on file prior to visit.     Review of Systems:  As per HPI- otherwise negative.   Physical Examination: Vitals:   11/14/16 1341  BP: 135/79  Pulse: 96  Temp: 98.4 F (36.9 C)   Vitals:   11/14/16 1341  Weight: 164 lb 3.2 oz (74.5 kg)  Height: 5' (1.524 m)   Body mass index is 32.07 kg/m. Ideal Body Weight: Weight in (lb) to have BMI = 25: 127.7  GEN: WDWN, NAD, Non-toxic, A & O x 3, obese, otherwise looks well HEENT: Atraumatic, Normocephalic. Neck supple. No masses, No LAD. Ears and Nose: No external deformity. CV: RRR with occasional missed or early beats suspicious for PVCs on exam, 2/6 early systolic murmur.  NoG/R. No JVD. No thrill. No extra heart sounds. PULM: CTA B, no wheezes, crackles, rhonchi. No retractions. No resp. distress. No accessory muscle use. ABD: S, NT, ND, +BS. No rebound. No HSM. EXTR: No c/c/e NEURO Normal gait.  PSYCH: Normally interactive. Conversant. Not depressed or anxious appearing.   Calm demeanor.   EKG: SR,rate 70s, mild changes from previous, suspect due to lead placement. No acute ST elevation or depression Assessment and Plan: Palpitations - Plan: EKG 12-Lead, Ambulatory referral to Cardiology, CBC, Basic metabolic panel, TSH  Here today with heart palpitations.  Admits that she is feeling quite nervous about this, especially as she is leaving town for a week on 7/17.  She would like to see cardiology which is reasonable- put in a referral for her Will also  check basic labs She does not appear to be on excessive antihypertensives at this time Pt advised to seek care right away if any worsening or change of her symptoms and she agrees   Signed Abbe Amsterdam, MD

## 2016-11-14 ENCOUNTER — Ambulatory Visit: Payer: Medicare Other | Admitting: Family Medicine

## 2016-11-14 ENCOUNTER — Ambulatory Visit (INDEPENDENT_AMBULATORY_CARE_PROVIDER_SITE_OTHER): Payer: Medicare Other | Admitting: Family Medicine

## 2016-11-14 VITALS — BP 135/79 | HR 96 | Temp 98.4°F | Ht 60.0 in | Wt 164.2 lb

## 2016-11-14 DIAGNOSIS — R002 Palpitations: Secondary | ICD-10-CM | POA: Diagnosis not present

## 2016-11-14 NOTE — Patient Instructions (Signed)
We will get you to see cardiology asap - should be prior to your trip I will get basic labs for you today Please start on a baby aspirin (81mg ) once a day until we have you see cardiology

## 2016-11-15 ENCOUNTER — Encounter: Payer: Self-pay | Admitting: Cardiology

## 2016-11-15 ENCOUNTER — Ambulatory Visit (INDEPENDENT_AMBULATORY_CARE_PROVIDER_SITE_OTHER): Payer: Medicare Other | Admitting: Cardiology

## 2016-11-15 VITALS — BP 128/76 | HR 90 | Ht 60.0 in | Wt 162.8 lb

## 2016-11-15 DIAGNOSIS — R002 Palpitations: Secondary | ICD-10-CM

## 2016-11-15 DIAGNOSIS — I10 Essential (primary) hypertension: Secondary | ICD-10-CM | POA: Diagnosis not present

## 2016-11-15 LAB — BASIC METABOLIC PANEL
BUN: 16 mg/dL (ref 6–23)
CALCIUM: 10.2 mg/dL (ref 8.4–10.5)
CHLORIDE: 101 meq/L (ref 96–112)
CO2: 25 meq/L (ref 19–32)
Creatinine, Ser: 0.77 mg/dL (ref 0.40–1.20)
GFR: 96.4 mL/min (ref >60.00)
Glucose, Bld: 100 mg/dL — ABNORMAL HIGH (ref 70–99)
POTASSIUM: 3.9 meq/L (ref 3.5–5.1)
SODIUM: 139 meq/L (ref 135–145)

## 2016-11-15 LAB — CBC
HCT: 42 % (ref 36.0–46.0)
Hemoglobin: 14.5 g/dL (ref 12.0–15.0)
MCHC: 34.4 g/dL (ref 30.0–36.0)
MCV: 91.4 fl (ref 78.0–100.0)
Platelets: 325 10*3/uL (ref 150.0–400.0)
RBC: 4.6 Mil/uL (ref 3.87–5.11)
RDW: 11.7 % (ref 11.5–15.5)
WBC: 5.7 10*3/uL (ref 4.0–10.5)

## 2016-11-15 LAB — TSH: TSH: 1.63 u[IU]/mL (ref 0.35–4.50)

## 2016-11-15 NOTE — Patient Instructions (Addendum)
Medication Instructions:    Your physician recommends that you continue on your current medications as directed. Please refer to the Current Medication list given to you today.  - If you need a refill on your cardiac medications before your next appointment, please call your pharmacy.   Labwork:  None ordered  Testing/Procedures: Your physician has recommended that you wear a 48hour holter monitor. Holter monitors are medical devices that record the heart's electrical activity. Doctors most often use these monitors to diagnose arrhythmias. Arrhythmias are problems with the speed or rhythm of the heartbeat. The monitor is a small, portable device. You can wear one while you do your normal daily activities. This is usually used to diagnose what is causing palpitations/syncope (passing out).  Follow-Up:  Follow up to be determined after monitor.  Thank you for choosing [facility]!!   Dory HornSherri Marlita Keil, RN [phone]  Any Other Special Instructions Will Be Listed Below (If Applicable).   Holter Monitoring A Holter monitor is a small device that is used to detect abnormal heart rhythms. It clips to your clothing and is connected by wires to flat, sticky disks (electrodes) that attach to your chest. It is worn continuously for 24-48 hours. Follow these instructions at home:  Wear your Holter monitor at all times, even while exercising and sleeping, for as long as directed by your health care provider.  Make sure that the Holter monitor is safely clipped to your clothing or close to your body as recommended by your health care provider.  Do not get the monitor or wires wet.  Do not put body lotion or moisturizer on your chest.  Keep your skin clean.  Keep a diary of your daily activities, such as walking and doing chores. If you feel that your heartbeat is abnormal or that your heart is fluttering or skipping a beat: ? Record what you are doing when it happens. ? Record what  time of day the symptoms occur.  Return your Holter monitor as directed by your health care provider.  Keep all follow-up visits as directed by your health care provider. This is important. Get help right away if:  You feel lightheaded or you faint.  You have trouble breathing.  You feel pain in your chest, upper arm, or jaw.  You feel sick to your stomach and your skin is pale, cool, or damp.  You heartbeat feels unusual or abnormal. This information is not intended to replace advice given to you by your health care provider. Make sure you discuss any questions you have with your health care provider. Document Released: 01/22/2004 Document Revised: 10/01/2015 Document Reviewed: 12/02/2013 Elsevier Interactive Patient Education  [employer]2018 Elsevier Inc.

## 2016-11-15 NOTE — Progress Notes (Signed)
Electrophysiology Office Note   Date:  11/15/2016   ID:  Anneta, Rounds [birth year], MRN [id number]  PCP:  Pearline Cables, MD  Primary Electrophysiologist:  Regan Lemming, MD    Chief Complaint  Patient presents with  . Advice Only    Palpitations  . Fatigue  . Dizziness     History of Present Illness: Victoria Hogan is a 66 y.o. female who is being seen today for the evaluation of palpitations at the request of Copland, Gwenlyn Found, MD. Presenting today for electrophysiology evaluation. She presented to her primary physician with feeling weak, palpitations, and lightheadedness. Her symptoms come and go. She has stopped drinking alcohol and coffee. She does have a history of syncope in 2013. This was thought to be due to overtreatment of her blood pressure with numerous diuretics. Her palpitations occur up to 3 times a day. Her symptoms have been going on for the last 2 weeks. There are no exacerbating or alleviating factors. Her episodes of palpitations last between 15 and 30 seconds.    Today, she denies symptoms of palpitations, chest pain, shortness of breath, orthopnea, PND, lower extremity edema, claudication, dizziness, presyncope, syncope, bleeding, or neurologic sequela. The patient is tolerating medications without difficulties.    Past Medical History:  Diagnosis Date  . Anxiety   . GERD (gastroesophageal reflux disease)   . History of colonic diverticulitis   . Hypertension   . Pneumonia 02/2012   Past Surgical History:  Procedure Laterality Date  . ABDOMINAL HYSTERECTOMY    . bunyonectomy       Current Outpatient Prescriptions  Medication Sig Dispense Refill  . acetaminophen (TYLENOL) 500 MG tablet Take 500 mg by mouth every 6 (six) hours as needed. headache    . losartan-hydrochlorothiazide (HYZAAR) 100-25 MG tablet TAKE 1 TABLET BY MOUTH DAILY. 90 tablet 1  . nitroGLYCERIN (NITROGLYN) 2 % ointment Apply topically 4 (four) times daily.    .  Vitamin D, Ergocalciferol, (DRISDOL) 50000 units CAPS capsule Take 1 capsule (50,000 Units total) by mouth every 7 (seven) days. 12 capsule 0   No current facility-administered medications for this visit.     Allergies:   Patient has no known allergies.   Social History:  The patient  reports that she has never smoked. She has never used smokeless tobacco. She reports that she does not drink alcohol or use drugs.   Family History:  The patient's family history includes Arthritis in her mother; Heart failure in her father.    ROS:  Please see the history of present illness.   Otherwise, review of systems is positive for palpitations, visual changes, depression, anxiety.   All other systems are reviewed and negative.    PHYSICAL EXAM: VS:  BP 128/76 (BP Location: Left Arm)   Pulse 90   Ht 5' (1.524 m)   Wt 162 lb 12.8 oz (73.8 kg)   BMI 31.79 kg/m  , BMI Body mass index is 31.79 kg/m. GEN: Well nourished, well developed, in no acute distress  HEENT: normal  Neck: no JVD, carotid bruits, or masses Cardiac: RRR; 2/6 murmur at the base, no rubs, or gallops,no edema  Respiratory:  clear to auscultation bilaterally, normal work of breathing GI: soft, nontender, nondistended, + BS MS: no deformity or atrophy  Skin: warm and dry Neuro:  Strength and sensation are intact Psych: euthymic mood, full affect  EKG:  EKG is not ordered today. Personal review of the ekg ordered 11/14/16 shows  sinus rhythm, LAFB  Recent Labs: 08/22/2016: ALT 19; BUN 12; Creatinine, Ser 0.74; Hemoglobin 13.9; Platelets 317.0; Potassium 4.4; Sodium 137; TSH 2.70    Lipid Panel     Component Value Date/Time   CHOL 242 (H) 08/22/2016 1328   TRIG 77.0 08/22/2016 1328   HDL 72.30 08/22/2016 1328   CHOLHDL 3 08/22/2016 1328   VLDL 15.4 08/22/2016 1328   LDLCALC 154 (H) 08/22/2016 1328     Wt Readings from Last 3 Encounters:  11/15/16 162 lb 12.8 oz (73.8 kg)  11/14/16 164 lb 3.2 oz (74.5 kg)  08/22/16 174  lb (78.9 kg)      Other studies Reviewed: Additional studies/ records that were reviewed today include: TTE 2013  Review of the above records today demonstrates:  - Left ventricle: The cavity size was normal. Wall thickness was normal. Systolic function was normal. The estimated ejection fraction was in the range of 55% to 60%. Wall motion was normal; there were no regional wall motion abnormalities. Left ventricular diastolic function parameters were normal. - Right ventricle: Systolic pressure was increased. - Tricuspid valve: Mild-moderate regurgitation. - Pulmonic valve: Mild regurgitation. - Pulmonary arteries: PA peak pressure: 40mm Hg (S).   ASSESSMENT AND PLAN:  1.  Palpitations: Is difficult to tell the source of her palpitations. Her EKG does not get any obvious clues. Her palpitations occur every day. It is possible that she is having SVTs. We'll plan for 48 hour monitoring as she does have symptoms on a daily basis. I do feel that it is okay for her to go on her trip to [place] in one week.  2. Hypertension: Blood pressure well controlled today. Per primary physician.    Current medicines are reviewed at length with the patient today.   The patient does not have concerns regarding her medicines.  The following changes were made today:  none  Labs/ tests ordered today include:  Orders Placed This Encounter  Procedures  . Holter monitor - 24 hour     Disposition:   FU with Bowe Sidor pending monitor months  Signed, Shiara Mcgough Jorja LoaMartin Kniyah Khun, MD  11/15/2016 9:13 AM     [facility] [address] [place] [address] [phone] (office) [phone] (fax)

## 2016-11-17 ENCOUNTER — Encounter: Payer: Self-pay | Admitting: Family Medicine

## 2016-11-28 ENCOUNTER — Ambulatory Visit (INDEPENDENT_AMBULATORY_CARE_PROVIDER_SITE_OTHER): Payer: Medicare Other

## 2016-11-28 DIAGNOSIS — R002 Palpitations: Secondary | ICD-10-CM | POA: Diagnosis not present

## 2016-12-06 NOTE — Progress Notes (Signed)
[facility] [address], [address] [place], [place] [address] [phone] [phone]  Date:  12/08/2016   Name:  Victoria Hogan Ostrom   DOB:  05/06/1951   MRN:  [id number]  PCP:  Pearline Cablesopland, Allysha Tryon C, MD    Chief Complaint: Hemorrhoids (c/o painful hemorrhoids, blood noticed with bowel movement) and Anxiety (Also have sx's of anxiety. )   History of Present Illness:  Victoria Hogan Florido is a 66 y.o. very pleasant female patient who presents with the following:  History of HTN and vitamin D def Last visit here about a month ago for palpitations  Her eval was reassuring -she did see cardiology and was set up for a holter monitor which she completed- this looked ok, she will follow-up with cardiology soon  Here today with concern of blood in her stool with BM for the last 4 years She may have pain with BM about twice a month also- however usually the blood is not accompanied by pain She is not constipated, no diarrhea or nausea  She thinks that this is due to hemorrhoids She did use some analpram about a month ago- however she then had the palpitations, thought they might be related and stopped using it  Last colonoscopy was in 2017- it was normal. She keeps up with screening colonoscopy on a regular basis.  She did use some nitroglycerin topically per her GI doc in the past.  She sees Dr. Jeani HawkingPatrick Hung for her GI care- he has done her colonoscopy and reassured her that there is no sign of cancer.  He has offered surgical treatment for hemorrhoids but Truddie HiddenLou did not feel that she was at that point yet Recent CBC was normal No change in her bleeding pattern The blood seems to be mixed in the stool or on the paper- will be pink in color.   Appetite is normal  At the end of our visit pt related that she had not come in about her hemorrhoids today, but actually to discuss symptoms that she thinks are due to panic attack.  She has had panic attacks in the past and  treated them with xanax successfully  She will note episodes of chest tightness, racing heart, feeling hot and cold, feeling of panic She has been seen several times by primary car and also has seen cardiology- she does not appear to have any cardiac pathology.  I agree that she is likely having panic attacks, and it would be reasonable to try a low dose of xanax prn  Patient Active Problem List   Diagnosis Date Noted  . Osteopenia 08/30/2016  . Overweight 08/22/2016  . Hypertension 04/08/2012  . Diverticulitis large intestine 03/23/2012  . Vitamin D deficiency 03/14/2012  . Heart murmur 03/06/2012    Past Medical History:  Diagnosis Date  . Anxiety   . GERD (gastroesophageal reflux disease)   . History of colonic diverticulitis   . Hypertension   . Pneumonia 02/2012    Past Surgical History:  Procedure Laterality Date  . ABDOMINAL HYSTERECTOMY    . bunyonectomy      Social History  Substance Use Topics  . Smoking status: Never Smoker  . Smokeless tobacco: Never Used  . Alcohol use No     Comment: occa    Family History  Problem Relation Age of Onset  . Heart failure Father   . Arthritis Mother     No Known Allergies  Medication list has  been reviewed and updated.  Current Outpatient Prescriptions on File Prior to Visit  Medication Sig Dispense Refill  . acetaminophen (TYLENOL) 500 MG tablet Take 500 mg by mouth every 6 (six) hours as needed. headache    . losartan-hydrochlorothiazide (HYZAAR) 100-25 MG tablet TAKE 1 TABLET BY MOUTH DAILY. 90 tablet 1  . Vitamin D, Ergocalciferol, (DRISDOL) 50000 units CAPS capsule Take 1 capsule (50,000 Units total) by mouth every 7 (seven) days. 12 capsule 0   No current facility-administered medications on file prior to visit.     Review of Systems:  As per HPI- otherwise negative. No fever or chills No nausea, vomiting   Physical Examination: Vitals:   12/08/16 1018  BP: 124/82  Pulse: 79  Temp: 98.1 F (36.7  C)   Vitals:   12/08/16 1018  Weight: 161 lb 9.6 oz (73.3 kg)  Height: 5' (1.524 m)   Body mass index is 31.56 kg/m. Ideal Body Weight: Weight in (lb) to have BMI = 25: 127.7  GEN: WDWN, NAD, Non-toxic, A & O x 3, obese, otherwise looks well HEENT: Atraumatic, Normocephalic. Neck supple. No masses, No LAD. Ears and Nose: No external deformity. CV: RRR, No M/G/R. No JVD. No thrill. No extra heart sounds. PULM: CTA B, no wheezes, crackles, rhonchi. No retractions. No resp. distress. No accessory muscle use. ABD: S, NT, ND, +BS. No rebound. No HSM. EXTR: No c/c/e NEURO Normal gait.  PSYCH: Normally interactive. Conversant. Not depressed or anxious appearing.  Calm demeanor.  Rectal exam: she has a small external hemorrhoid  Assessment and Plan: Rectal bleeding  Panic attacks - Plan: ALPRAZolam (XANAX) 0.25 MG tablet  Here today I thought to discuss her rectal bleeding- she has already seen GI and had a negative colonoscopy last year, reassured her that she has completed the further eval that I would recommend and that this is likely just a nuisance She then wanted to discuss her panic attacks- will have her try xanax sparingly as needed, being cautious of sedation, and will let me know how this works for her    Signed Abbe AmsterdamJessica Devarius Nelles, MD

## 2016-12-08 ENCOUNTER — Ambulatory Visit (INDEPENDENT_AMBULATORY_CARE_PROVIDER_SITE_OTHER): Payer: Medicare Other | Admitting: Family Medicine

## 2016-12-08 VITALS — BP 124/82 | HR 79 | Temp 98.1°F | Ht 60.0 in | Wt 161.6 lb

## 2016-12-08 DIAGNOSIS — K625 Hemorrhage of anus and rectum: Secondary | ICD-10-CM | POA: Diagnosis not present

## 2016-12-08 DIAGNOSIS — F410 Panic disorder [episodic paroxysmal anxiety] without agoraphobia: Secondary | ICD-10-CM | POA: Diagnosis not present

## 2016-12-08 MED ORDER — ALPRAZOLAM 0.25 MG PO TABS: 0.2500 mg | ORAL_TABLET | Freq: Two times a day (BID) | ORAL | 0 refills | Status: DC | PRN

## 2016-12-08 NOTE — Patient Instructions (Addendum)
You have done what I would recommend to evaluate your rectal bleeding- it does not seem that this is anything of concern Take care and let me know if any change in your symptoms   We will try a low dose of xanax as needed for your panic symptoms.  Take a 1/2 or 1 tablet up to twice a day as needed- remember this can be sedating and habit forming, so use sparingly Please let me know how this is working for you!

## 2016-12-09 ENCOUNTER — Other Ambulatory Visit: Payer: Self-pay | Admitting: Family Medicine

## 2016-12-16 ENCOUNTER — Encounter: Payer: Self-pay | Admitting: *Deleted

## 2017-01-03 ENCOUNTER — Ambulatory Visit: Payer: Medicare Other | Admitting: Cardiology

## 2017-04-14 ENCOUNTER — Other Ambulatory Visit: Payer: Self-pay | Admitting: Family Medicine

## 2017-04-14 DIAGNOSIS — F410 Panic disorder [episodic paroxysmal anxiety] without agoraphobia: Secondary | ICD-10-CM

## 2017-04-14 MED ORDER — ALPRAZOLAM 0.25 MG PO TABS: 0.2500 mg | ORAL_TABLET | Freq: Two times a day (BID) | ORAL | 0 refills | Status: DC | PRN

## 2017-04-14 NOTE — Telephone Encounter (Signed)
Pt is requesting refill on alprazolam 0.25mg . Please advise.

## 2017-05-04 ENCOUNTER — Ambulatory Visit: Payer: Medicare Other | Admitting: Family Medicine

## 2017-05-04 ENCOUNTER — Encounter: Payer: Self-pay | Admitting: Family Medicine

## 2017-05-04 VITALS — BP 140/89 | HR 96 | Temp 98.7°F | Resp 16 | Ht 60.0 in | Wt 163.2 lb

## 2017-05-04 DIAGNOSIS — J209 Acute bronchitis, unspecified: Secondary | ICD-10-CM

## 2017-05-04 MED ORDER — AZITHROMYCIN 250 MG PO TABS: ORAL_TABLET | ORAL | 0 refills | Status: DC

## 2017-05-04 MED ORDER — ALBUTEROL SULFATE HFA 108 (90 BASE) MCG/ACT IN AERS: 2.0000 | INHALATION_SPRAY | Freq: Four times a day (QID) | RESPIRATORY_TRACT | 0 refills | Status: DC | PRN

## 2017-05-04 NOTE — Progress Notes (Signed)
[facility] [address] [place], [place] [address] [phone] [phone]  Date:  05/04/2017   Name:  Victoria Hogan   DOB:  01/23/1951   MRN:  [id number]  PCP:  Pearline Cablesopland, Lanetta Figuero C, MD    Chief Complaint: No chief complaint on file.   History of Present Illness:  Victoria Hogan is a 66 y.o. very pleasant female patient who presents with the following:  She is here today with illness She has noted a cough for about 7- 10 days.   A couple of nights ago she felt very hot, but is not sure if she had a fever Her cough is not generally productive No post- tussive emesis She has noted chills, no body aches Mild HA only- she may take an ibuprofen for this No GI symptoms- no vomiting or diarrhea Decreased appetite but she is still eating some  BP Readings from Last 3 Encounters:  05/04/17 (!) 148/90  12/08/16 124/82  11/15/16 128/76   She is using otc ibuprofen, delsym that she used just once  No sick contacts   Patient Active Problem List   Diagnosis Date Noted  . Osteopenia 08/30/2016  . Overweight 08/22/2016  . Hypertension 04/08/2012  . Diverticulitis large intestine 03/23/2012  . Vitamin D deficiency 03/14/2012  . Heart murmur 03/06/2012    Past Medical History:  Diagnosis Date  . Anxiety   . GERD (gastroesophageal reflux disease)   . History of colonic diverticulitis   . Hypertension   . Pneumonia 02/2012    Past Surgical History:  Procedure Laterality Date  . ABDOMINAL HYSTERECTOMY    . bunyonectomy      Social History   Tobacco Use  . Smoking status: Never Smoker  . Smokeless tobacco: Never Used  Substance Use Topics  . Alcohol use: No    Comment: occa  . Drug use: No    Family History  Problem Relation Age of Onset  . Heart failure Father   . Arthritis Mother     No Known Allergies  Medication list has been reviewed and updated.  Current Outpatient Medications on File Prior to Visit   Medication Sig Dispense Refill  . acetaminophen (TYLENOL) 500 MG tablet Take 500 mg by mouth every 6 (six) hours as needed. headache    . ALPRAZolam (XANAX) 0.25 MG tablet Take 1 tablet (0.25 mg total) by mouth 2 (two) times daily as needed for anxiety. 30 tablet 0  . losartan-hydrochlorothiazide (HYZAAR) 100-25 MG tablet TAKE 1 TABLET BY MOUTH DAILY. 90 tablet 1  . Vitamin D, Ergocalciferol, (DRISDOL) 50000 units CAPS capsule Take 1 capsule (50,000 Units total) by mouth every 7 (seven) days. 12 capsule 0   No current facility-administered medications on file prior to visit.     Review of Systems:  As per HPI- otherwise negative. No fever today No GI symptoms    Physical Examination: Vitals:   05/04/17 1505  BP: (!) 148/90  Pulse: (!) 104  Resp: 16  Temp: 98.7 F (37.1 C)  SpO2: 98%   Vitals:   05/04/17 1505  Weight: 163 lb 3.2 oz (74 kg)  Height: 5' (1.524 m)   Body mass index is 31.87 kg/m. Ideal Body Weight: Weight in (lb) to have BMI = 25: 127.7  GEN: WDWN, NAD, Non-toxic, A & O x 3, looks well, coughing some in room  HEENT: Atraumatic, Normocephalic. Neck supple. No masses, No LAD. Bilateral TM wnl,  oropharynx normal.  PEERL,EOMI.   Ears and Nose: No external deformity. CV: RRR, No M/G/R. No JVD. No thrill. No extra heart sounds. PULM: no crackles, no rhonchi. No retractions. No resp. distress. No accessory muscle use.  Minor wheezing bilaterally  ABD: S, NT, ND, +BS. No rebound. No HSM. EXTR: No c/c/e NEURO Normal gait.  PSYCH: Normally interactive. Conversant. Not depressed or anxious appearing.  Calm demeanor.    Assessment and Plan: Acute bronchitis, unspecified organism - Plan: albuterol (PROVENTIL HFA;VENTOLIN HFA) 108 (90 Base) MCG/ACT inhaler, azithromycin (ZITHROMAX) 250 MG tablet  Treat for bronchitis with albuterol and azithromycin She will alert me if not feeling better in the next few days- Sooner if worse.     Signed Abbe AmsterdamJessica Johnryan Sao, MD

## 2017-05-04 NOTE — Patient Instructions (Signed)
We are going to treat you for bronchitis Use the albuterol as needed for cough and wheezing Use the azithromycin as directed Please rest and drink plenty of fluids Let me know if you are not feeling better in the next few days- Sooner if worse.

## 2017-09-01 ENCOUNTER — Encounter: Payer: Self-pay | Admitting: Family Medicine

## 2017-09-01 DIAGNOSIS — I10 Essential (primary) hypertension: Secondary | ICD-10-CM

## 2017-09-04 MED ORDER — HYDROCHLOROTHIAZIDE 25 MG PO TABS: 25.0000 mg | ORAL_TABLET | Freq: Every day | ORAL | 2 refills | Status: DC

## 2017-09-04 MED ORDER — LOSARTAN POTASSIUM-HCTZ 100-25 MG PO TABS: 1.0000 | ORAL_TABLET | Freq: Every day | ORAL | 1 refills | Status: DC

## 2017-09-04 MED ORDER — LOSARTAN POTASSIUM 100 MG PO TABS
100.0000 mg | ORAL_TABLET | Freq: Every day | ORAL | 2 refills | Status: DC
Start: 2017-09-04 — End: 2018-02-01

## 2017-09-04 NOTE — Addendum Note (Signed)
Addended by: Abbe Amsterdam C on: 09/04/2017 06:42 PM   Modules accepted: Orders

## 2017-09-04 NOTE — Telephone Encounter (Signed)
Called pharmacy to inquire - they have plain losartan 100.  Will split rx for pt, message sent to pt

## 2017-09-08 NOTE — Progress Notes (Addendum)
Subjective:   Victoria Hogan is a 66 y.o. female who presents for Medicare Annual (Subsequent) preventive examination. 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   Review of Systems: No ROS.  Medicare Wellness Visit. Additional risk factors are reflected in the social history.  Cardiac Risk Factors include: advanced age (>33men, >67 women);hypertension Sleep patterns:Sleeps well per pt. Home Safety/Smoke Alarms: Feels safe in home. Smoke alarms in place.  Living environment; residence and Firearm Safety: Lives in 1 story home. Sons live nearby and check on her daily. Has Walk in shower.  Seat Belt Safety/Bike Helmet: Wears seat belt.   Female:   Pap-hysterectomy       Mammo- ordered       Dexa scan-utd        CCS- last 2017 per pt    Objective:     Vitals: BP 138/82 (BP Location: Left Arm, Patient Position: Sitting, Cuff Size: Normal)   Pulse 82   Ht 5' (1.524 m)   Wt 162 lb 6.4 oz (73.7 kg)   SpO2 98%   BMI 31.72 kg/m   Body mass index is 31.72 kg/m.  Advanced Directives 09/12/2017 03/06/2012  Does Patient Have a Medical Advance Directive? No Patient does not have advance directive  Would patient like information on creating a medical advance directive? Yes (MAU/Ambulatory/Procedural Areas - Information given) -  Pre-existing out of facility DNR order (yellow form or pink MOST form) - No    Tobacco Social History   Tobacco Use  Smoking Status Never Smoker  Smokeless Tobacco Never Used     Counseling given: Not Answered   Clinical Intake: Pain : No/denies pain    Past Medical History:  Diagnosis Date  . Anxiety   . GERD (gastroesophageal reflux disease)   . History of colonic diverticulitis   . Hypertension   . Pneumonia 02/2012   Past Surgical History:  Procedure Laterality Date  . ABDOMINAL HYSTERECTOMY    . bunyonectomy     Family History  Problem Relation Age of Onset  . Heart failure Father   . Arthritis Mother    Social History    Socioeconomic History  . Marital status: Divorced    Spouse name: Not on file  . Number of children: Not on file  . Years of education: college  . Highest education level: Not on file  Occupational History  . Occupation: Magazine features editor: [employer]  Social Needs  . Financial resource strain: Not on file  . Food insecurity:    Worry: Not on file    Inability: Not on file  . Transportation needs:    Medical: Not on file    Non-medical: Not on file  Tobacco Use  . Smoking status: Never Smoker  . Smokeless tobacco: Never Used  Substance and Sexual Activity  . Alcohol use: Yes    Comment: 1-3 daily  . Drug use: No  . Sexual activity: Not Currently  Lifestyle  . Physical activity:    Days per week: Not on file    Minutes per session: Not on file  . Stress: Not on file  Relationships  . Social connections:    Talks on phone: Not on file    Gets together: Not on file    Attends religious service: Not on file    Active member of club or organization: Not on file    Attends meetings of clubs or organizations: Not on file    Relationship status: Not on  file  Other Topics Concern  . Not on file  Social History Narrative   Bikes daily for exercise.    Outpatient Encounter Medications as of 09/12/2017  Medication Sig  . ALPRAZolam (XANAX) 0.25 MG tablet Take 1 tablet (0.25 mg total) by mouth 2 (two) times daily as needed for anxiety.  Marland Kitchen losartan-hydrochlorothiazide (HYZAAR) 100-25 MG tablet Take 1 tablet by mouth daily.  Marland Kitchen acetaminophen (TYLENOL) 500 MG tablet Take 500 mg by mouth every 6 (six) hours as needed. headache  . albuterol (PROVENTIL HFA;VENTOLIN HFA) 108 (90 Base) MCG/ACT inhaler Inhale 2 puffs into the lungs every 6 (six) hours as needed for wheezing or shortness of breath. (Patient not taking: Reported on 09/12/2017)  . hydrochlorothiazide (HYDRODIURIL) 25 MG tablet Take 1 tablet (25 mg total) by mouth daily. (Patient not taking: Reported on 09/12/2017)   . losartan (COZAAR) 100 MG tablet Take 1 tablet (100 mg total) by mouth daily. (Patient not taking: Reported on 09/12/2017)  . Vitamin D, Ergocalciferol, (DRISDOL) 50000 units CAPS capsule Take 1 capsule (50,000 Units total) by mouth every 7 (seven) days. (Patient not taking: Reported on 09/12/2017)  . [DISCONTINUED] azithromycin (ZITHROMAX) 250 MG tablet Use as a zpack   No facility-administered encounter medications on file as of 09/12/2017.     Activities of Daily Living In your present state of health, do you have any difficulty performing the following activities: 09/12/2017  Hearing? N  Vision? N  Comment wears reading glasses.  Difficulty concentrating or making decisions? N  Walking or climbing stairs? N  Dressing or bathing? N  Doing errands, shopping? N  Preparing Food and eating ? N  Using the Toilet? N  In the past six months, have you accidently leaked urine? N  Do you have problems with loss of bowel control? N  Managing your Medications? N  Managing your Finances? N  Housekeeping or managing your Housekeeping? N  Some recent data might be hidden    Patient Care Team: Copland, Gwenlyn Found, MD as PCP - General (Family Medicine) Jeani Hawking, MD as Attending Physician (Gastroenterology)    Assessment:   This is a routine wellness examination for Victoria Hogan. Physical assessment deferred to PCP.   Exercise Activities and Dietary recommendations Current Exercise Habits: Home exercise routine, Type of exercise: walking, Time (Minutes): 20, Frequency (Times/Week): 3, Weekly Exercise (Minutes/Week): 60, Intensity: Mild   Diet (meal preparation, eat out, water intake, caffeinated beverages, dairy products, fruits and vegetables): well balanced, on average, 3 meals per day  Goals    . Continue to keep a positive perspective       Fall Risk Fall Risk  09/12/2017 10/22/2015 10/20/2015 02/09/2015 11/24/2014  Falls in the past year? No No No Yes Yes  Number falls in past yr: - - - 1 1   Comment - - - - Tripped on sidewalk  Injury with Fall? - - - - No    Depression Screen PHQ 2/9 Scores 09/12/2017 10/22/2015 10/20/2015 02/09/2015  PHQ - 2 Score 0 0 0 0  PHQ- 9 Score - - - -     Cognitive Function Ad8 score reviewed for issues:  Issues making decisions:no  Less interest in hobbies / activities:no  Repeats questions, stories (family complaining):no  Trouble using ordinary gadgets (microwave, computer, phone):no  Forgets the month or year: no  Mismanaging finances: no  Remembering appts:no Daily problems with thinking and/or memory:no Ad8 score is=0         Immunization History  Administered Date(s)  Administered  . Influenza Split 03/07/2012  . Influenza,inj,Quad PF,6+ Mos 04/10/2013, 02/21/2014, 02/09/2015  . Pneumococcal Conjugate-13 08/22/2016  . Pneumococcal Polysaccharide-23 03/07/2012  . Tdap 12/05/2012     Screening Tests Health Maintenance  Topic Date Due  . INFLUENZA VACCINE  12/07/2017  . MAMMOGRAM  08/30/2018  . COLONOSCOPY  10/14/2018  . TETANUS/TDAP  12/06/2022  . DEXA SCAN  Completed  . Hepatitis C Screening  Completed  . PNA vac Low Risk Adult  Completed    Plan:   Follow up with PCP as directed  Schedule appointment to follow up with me in 1 yr  I have ordered your mammogram. Please schedule.   Eat heart healthy diet (full of fruits, vegetables, whole grains, lean protein, water--limit salt, fat, and sugar intake) and increase physical activity as tolerated.  Continue doing brain stimulating activities (puzzles, reading, adult coloring books, staying active) to keep memory sharp.   Bring a copy of your living will and/or healthcare power of attorney to your next office visit.  I have personally reviewed and noted the following in the patient's chart:   . Medical and social history . Use of alcohol, tobacco or illicit drugs  . Current medications and supplements . Functional ability and status . Nutritional  status . Physical activity . Advanced directives . List of other physicians . Hospitalizations, surgeries, and ER visits in previous 12 months . Vitals . Screenings to include cognitive, depression, and falls . Referrals and appointments  In addition, I have reviewed and discussed with patient certain preventive protocols, quality metrics, and best practice recommendations. A written personalized care plan for preventive services as well as general preventive health recommendations were provided to patient.     [place]on Gully, [place]  09/12/2017   Medical screening examination/treatment was performed by qualified clinical staff member and as supervising physician I was immediately available for consultation/collaboration. I have reviewed documentation and agree with assessment and plan.  Danise Edge, MD

## 2017-09-12 ENCOUNTER — Encounter: Payer: Self-pay | Admitting: *Deleted

## 2017-09-12 ENCOUNTER — Ambulatory Visit (INDEPENDENT_AMBULATORY_CARE_PROVIDER_SITE_OTHER): Payer: Medicare Other | Admitting: *Deleted

## 2017-09-12 VITALS — BP 138/82 | HR 82 | Ht 60.0 in | Wt 162.4 lb

## 2017-09-12 DIAGNOSIS — Z0000 Encounter for general adult medical examination without abnormal findings: Secondary | ICD-10-CM

## 2017-09-12 DIAGNOSIS — Z1239 Encounter for other screening for malignant neoplasm of breast: Secondary | ICD-10-CM

## 2017-09-12 DIAGNOSIS — Z1231 Encounter for screening mammogram for malignant neoplasm of breast: Secondary | ICD-10-CM

## 2017-09-12 NOTE — Patient Instructions (Signed)
Schedule appointment to follow up with me in 1 yr  I have ordered your mammogram. Please schedule.   Eat heart healthy diet (full of fruits, vegetables, whole grains, lean protein, water--limit salt, fat, and sugar intake) and increase physical activity as tolerated.  Continue doing brain stimulating activities (puzzles, reading, adult coloring books, staying active) to keep memory sharp.   Bring a copy of your living will and/or healthcare power of attorney to your next office visit.   Victoria Hogan , Thank you for taking time to come for your Medicare Wellness Visit. I appreciate your ongoing commitment to your health goals. Please review the following plan we discussed and let me know if I can assist you in the future.   These are the goals we discussed: Goals    . Continue to keep a positive perspective       This is a list of the screening recommended for you and due dates:  Health Maintenance  Topic Date Due  . Flu Shot  12/07/2017  . Mammogram  08/30/2018  . Colon Cancer Screening  10/14/2018  . Tetanus Vaccine  12/06/2022  . DEXA scan (bone density measurement)  Completed  .  Hepatitis C: One time screening is recommended by Center for Disease Control  (CDC) for  adults born from [birth year] through 1965.   Completed  . Pneumonia vaccines  Completed    Health Maintenance for Postmenopausal Women Menopause is a normal process in which your reproductive ability comes to an end. This process happens gradually over a span of months to years, usually between the ages of 20 and 73. Menopause is complete when you have missed 12 consecutive menstrual periods. It is important to talk with your health care provider about some of the most common conditions that affect postmenopausal women, such as heart disease, cancer, and bone loss (osteoporosis). Adopting a healthy lifestyle and getting preventive care can help to promote your health and wellness. Those actions can also lower your chances of  developing some of these common conditions. What should I know about menopause? During menopause, you may experience a number of symptoms, such as:  Moderate-to-severe hot flashes.  Night sweats.  Decrease in sex drive.  Mood swings.  Headaches.  Tiredness.  Irritability.  Memory problems.  Insomnia.  Choosing to treat or not to treat menopausal changes is an individual decision that you make with your health care provider. What should I know about hormone replacement therapy and supplements? Hormone therapy products are effective for treating symptoms that are associated with menopause, such as hot flashes and night sweats. Hormone replacement carries certain risks, especially as you become older. If you are thinking about using estrogen or estrogen with progestin treatments, discuss the benefits and risks with your health care provider. What should I know about heart disease and stroke? Heart disease, heart attack, and stroke become more likely as you age. This may be due, in part, to the hormonal changes that your body experiences during menopause. These can affect how your body processes dietary fats, triglycerides, and cholesterol. Heart attack and stroke are both medical emergencies. There are many things that you can do to help prevent heart disease and stroke:  Have your blood pressure checked at least every 1-2 years. High blood pressure causes heart disease and increases the risk of stroke.  If you are 67-88 years old, ask your health care provider if you should take aspirin to prevent a heart attack or a stroke.  Do not  use any tobacco products, including cigarettes, chewing tobacco, or electronic cigarettes. If you need help quitting, ask your health care provider.  It is important to eat a healthy diet and maintain a healthy weight. ? Be sure to include plenty of vegetables, fruits, low-fat dairy products, and lean protein. ? Avoid eating foods that are high in solid  fats, added sugars, or salt (sodium).  Get regular exercise. This is one of the most important things that you can do for your health. ? Try to exercise for at least 150 minutes each week. The type of exercise that you do should increase your heart rate and make you sweat. This is known as moderate-intensity exercise. ? Try to do strengthening exercises at least twice each week. Do these in addition to the moderate-intensity exercise.  Know your numbers.Ask your health care provider to check your cholesterol and your blood glucose. Continue to have your blood tested as directed by your health care provider.  What should I know about cancer screening? There are several types of cancer. Take the following steps to reduce your risk and to catch any cancer development as early as possible. Breast Cancer  Practice breast self-awareness. ? This means understanding how your breasts normally appear and feel. ? It also means doing regular breast self-exams. Let your health care provider know about any changes, no matter how small.  If you are 40 or older, have a clinician do a breast exam (clinical breast exam or CBE) every year. Depending on your age, family history, and medical history, it may be recommended that you also have a yearly breast X-ray (mammogram).  If you have a family history of breast cancer, talk with your health care provider about genetic screening.  If you are at high risk for breast cancer, talk with your health care provider about having an MRI and a mammogram every year.  Breast cancer (BRCA) gene test is recommended for women who have family members with BRCA-related cancers. Results of the assessment will determine the need for genetic counseling and BRCA1 and for BRCA2 testing. BRCA-related cancers include these types: ? Breast. This occurs in males or females. ? Ovarian. ? Tubal. This may also be called fallopian tube cancer. ? Cancer of the abdominal or pelvic lining  (peritoneal cancer). ? Prostate. ? Pancreatic.  Cervical, Uterine, and Ovarian Cancer Your health care provider may recommend that you be screened regularly for cancer of the pelvic organs. These include your ovaries, uterus, and vagina. This screening involves a pelvic exam, which includes checking for microscopic changes to the surface of your cervix (Pap test).  For women ages 21-65, health care providers may recommend a pelvic exam and a Pap test every three years. For women ages 30-65, they may recommend the Pap test and pelvic exam, combined with testing for human papilloma virus (HPV), every five years. Some types of HPV increase your risk of cervical cancer. Testing for HPV may also be done on women of any age who have unclear Pap test results.  Other health care providers may not recommend any screening for nonpregnant women who are considered low risk for pelvic cancer and have no symptoms. Ask your health care provider if a screening pelvic exam is right for you.  If you have had past treatment for cervical cancer or a condition that could lead to cancer, you need Pap tests and screening for cancer for at least 20 years after your treatment. If Pap tests have been discontinued for you,   your risk factors (such as having a new sexual partner) need to be reassessed to determine if you should start having screenings again. Some women have medical problems that increase the chance of getting cervical cancer. In these cases, your health care provider may recommend that you have screening and Pap tests more often.  If you have a family history of uterine cancer or ovarian cancer, talk with your health care provider about genetic screening.  If you have vaginal bleeding after reaching menopause, tell your health care provider.  There are currently no reliable tests available to screen for ovarian cancer.  Lung Cancer Lung cancer screening is recommended for adults 55-80 years old who are at  high risk for lung cancer because of a history of smoking. A yearly low-dose CT scan of the lungs is recommended if you:  Currently smoke.  Have a history of at least 30 pack-years of smoking and you currently smoke or have quit within the past 15 years. A pack-year is smoking an average of one pack of cigarettes per day for one year.  Yearly screening should:  Continue until it has been 15 years since you quit.  Stop if you develop a health problem that would prevent you from having lung cancer treatment.  Colorectal Cancer  This type of cancer can be detected and can often be prevented.  Routine colorectal cancer screening usually begins at age 50 and continues through age 75.  If you have risk factors for colon cancer, your health care provider may recommend that you be screened at an earlier age.  If you have a family history of colorectal cancer, talk with your health care provider about genetic screening.  Your health care provider may also recommend using home test kits to check for hidden blood in your stool.  A small camera at the end of a tube can be used to examine your colon directly (sigmoidoscopy or colonoscopy). This is done to check for the earliest forms of colorectal cancer.  Direct examination of the colon should be repeated every 5-10 years until age 75. However, if early forms of precancerous polyps or small growths are found or if you have a family history or genetic risk for colorectal cancer, you may need to be screened more often.  Skin Cancer  Check your skin from head to toe regularly.  Monitor any moles. Be sure to tell your health care provider: ? About any new moles or changes in moles, especially if there is a change in a mole's shape or color. ? If you have a mole that is larger than the size of a pencil eraser.  If any of your family members has a history of skin cancer, especially at a young age, talk with your health care provider about genetic  screening.  Always use sunscreen. Apply sunscreen liberally and repeatedly throughout the day.  Whenever you are outside, protect yourself by wearing long sleeves, pants, a wide-brimmed hat, and sunglasses.  What should I know about osteoporosis? Osteoporosis is a condition in which bone destruction happens more quickly than new bone creation. After menopause, you may be at an increased risk for osteoporosis. To help prevent osteoporosis or the bone fractures that can happen because of osteoporosis, the following is recommended:  If you are 19-50 years old, get at least 1,000 mg of calcium and at least 600 mg of vitamin D per day.  If you are older than age 50 but younger than age 70, get at least   1,200 mg of calcium and at least 600 mg of vitamin D per day.  If you are older than age 74, get at least 1,200 mg of calcium and at least 800 mg of vitamin D per day.  Smoking and excessive alcohol intake increase the risk of osteoporosis. Eat foods that are rich in calcium and vitamin D, and do weight-bearing exercises several times each week as directed by your health care provider. What should I know about how menopause affects my mental health? Depression may occur at any age, but it is more common as you become older. Common symptoms of depression include:  Low or sad mood.  Changes in sleep patterns.  Changes in appetite or eating patterns.  Feeling an overall lack of motivation or enjoyment of activities that you previously enjoyed.  Frequent crying spells.  Talk with your health care provider if you think that you are experiencing depression. What should I know about immunizations? It is important that you get and maintain your immunizations. These include:  Tetanus, diphtheria, and pertussis (Tdap) booster vaccine.  Influenza every year before the flu season begins.  Pneumonia vaccine.  Shingles vaccine.  Your health care provider may also recommend other  immunizations. This information is not intended to replace advice given to you by your health care provider. Make sure you discuss any questions you have with your health care provider. Document Released: 06/17/2005 Document Revised: 11/13/2015 Document Reviewed: 01/27/2015 Elsevier Interactive Patient Education  2018 [employer].

## 2017-09-20 ENCOUNTER — Ambulatory Visit (HOSPITAL_BASED_OUTPATIENT_CLINIC_OR_DEPARTMENT_OTHER)
Admission: RE | Admit: 2017-09-20 | Discharge: 2017-09-20 | Disposition: A | Discharge status: Home / Self Care | Payer: Medicare Other | Source: Ambulatory Visit | Attending: Family Medicine | Admitting: Family Medicine

## 2017-09-20 DIAGNOSIS — Z1231 Encounter for screening mammogram for malignant neoplasm of breast: Secondary | ICD-10-CM | POA: Diagnosis present

## 2017-09-20 DIAGNOSIS — Z1239 Encounter for other screening for malignant neoplasm of breast: Secondary | ICD-10-CM

## 2018-02-01 ENCOUNTER — Ambulatory Visit: Payer: Medicare Other | Admitting: Family Medicine

## 2018-02-01 ENCOUNTER — Encounter: Payer: Self-pay | Admitting: Family Medicine

## 2018-02-01 VITALS — BP 140/88 | HR 96 | Resp 16 | Ht 60.0 in | Wt 165.0 lb

## 2018-02-01 DIAGNOSIS — Z130 Encounter for screening for diseases of the blood and blood-forming organs and certain disorders involving the immune mechanism: Secondary | ICD-10-CM

## 2018-02-01 DIAGNOSIS — Z23 Encounter for immunization: Secondary | ICD-10-CM

## 2018-02-01 DIAGNOSIS — I10 Essential (primary) hypertension: Secondary | ICD-10-CM | POA: Diagnosis not present

## 2018-02-01 DIAGNOSIS — H6123 Impacted cerumen, bilateral: Secondary | ICD-10-CM

## 2018-02-01 DIAGNOSIS — Z131 Encounter for screening for diabetes mellitus: Secondary | ICD-10-CM

## 2018-02-01 DIAGNOSIS — Z13220 Encounter for screening for lipoid disorders: Secondary | ICD-10-CM

## 2018-02-01 LAB — COMPREHENSIVE METABOLIC PANEL
ALBUMIN: 4.4 g/dL (ref 3.5–5.2)
ALT: 13 U/L (ref 0–35)
AST: 13 U/L (ref 0–37)
Alkaline Phosphatase: 70 U/L (ref 39–117)
BUN: 12 mg/dL (ref 6–23)
CHLORIDE: 100 meq/L (ref 96–112)
CO2: 31 mEq/L (ref 19–32)
CREATININE: 0.75 mg/dL (ref 0.40–1.20)
Calcium: 9.8 mg/dL (ref 8.4–10.5)
GFR: 99 mL/min (ref >60.00)
GLUCOSE: 100 mg/dL — AB (ref 70–99)
POTASSIUM: 4 meq/L (ref 3.5–5.1)
SODIUM: 138 meq/L (ref 135–145)
Total Bilirubin: 0.4 mg/dL (ref 0.2–1.2)
Total Protein: 7.2 g/dL (ref 6.0–8.3)

## 2018-02-01 LAB — CBC
HCT: 38.9 % (ref 36.0–46.0)
Hemoglobin: 13.6 g/dL (ref 12.0–15.0)
MCHC: 35.1 g/dL (ref 30.0–36.0)
MCV: 90.7 fl (ref 78.0–100.0)
Platelets: 285 10*3/uL (ref 150.0–400.0)
RBC: 4.29 Mil/uL (ref 3.87–5.11)
RDW: 11.9 % (ref 11.5–15.5)
WBC: 5.6 10*3/uL (ref 4.0–10.5)

## 2018-02-01 LAB — LIPID PANEL
CHOL/HDL RATIO: 3
Cholesterol: 195 mg/dL (ref 0–200)
HDL: 57.1 mg/dL (ref >39.00)
LDL CALC: 122 mg/dL — AB (ref 0–99)
NONHDL: 138.35
Triglycerides: 80 mg/dL (ref 0.0–149.0)
VLDL: 16 mg/dL (ref 0.0–40.0)

## 2018-02-01 LAB — HEMOGLOBIN A1C: HEMOGLOBIN A1C: 5.6 % (ref 4.6–6.5)

## 2018-02-01 MED ORDER — AMLODIPINE BESYLATE 2.5 MG PO TABS: 2.5000 mg | ORAL_TABLET | Freq: Every day | ORAL | 6 refills | Status: DC

## 2018-02-01 NOTE — Progress Notes (Addendum)
[facility] [address], [address] [place], [place] [address] [phone]  Date:  02/01/2018   Name:  Victoria Hogan   DOB:  Mar 02, 1951   MRN:  [id number]  PCP:  Pearline Cables, MD    Chief Complaint: Anxiety (has subsided since stopping losartan, going on for the past 5 years, depression)   History of Present Illness:  Victoria Hogan is a 67 y.o. very pleasant female patient who presents with the following:  Last seen by myself in December of last year- history of HTN, anxiety She has been on losartan/hctz for several years for her BP Apparently her pharmacy called her and told her that her   She feels that since she stopped taking her BP meds her depression, "stomach jitters and all the craziness" went away She stopped taking it 3 days ago and feels like she is totally different in a good way.  I am not sure if we can attribute all of this change to stopping her losartan, but we can certainly try a different BP med if she likes   Flu shot: today  She also noted that her chronic tinnitus seems to be worse than usual recently- ? Can we check her ears today   BP Readings from Last 3 Encounters:  02/01/18 140/88  09/12/17 138/82  05/04/17 140/89     Patient Active Problem List   Diagnosis Date Noted  . Osteopenia 08/30/2016  . Overweight 08/22/2016  . Hypertension 04/08/2012  . Diverticulitis large intestine 03/23/2012  . Vitamin D deficiency 03/14/2012  . Heart murmur 03/06/2012    Past Medical History:  Diagnosis Date  . Anxiety   . GERD (gastroesophageal reflux disease)   . History of colonic diverticulitis   . Hypertension   . Pneumonia 02/2012    Past Surgical History:  Procedure Laterality Date  . ABDOMINAL HYSTERECTOMY    . bunyonectomy      Social History   Tobacco Use  . Smoking status: Never Smoker  . Smokeless tobacco: Never Used  Substance Use Topics  . Alcohol use: Yes    Comment:  1-3 daily  . Drug use: No    Family History  Problem Relation Age of Onset  . Heart failure Father   . Arthritis Mother     No Known Allergies  Medication list has been reviewed and updated.  Current Outpatient Medications on File Prior to Visit  Medication Sig Dispense Refill  . albuterol (PROVENTIL HFA;VENTOLIN HFA) 108 (90 Base) MCG/ACT inhaler Inhale 2 puffs into the lungs every 6 (six) hours as needed for wheezing or shortness of breath. (Patient not taking: Reported on 09/12/2017) 1 Inhaler 0  . hydrochlorothiazide (HYDRODIURIL) 25 MG tablet Take 1 tablet (25 mg total) by mouth daily. (Patient not taking: Reported on 09/12/2017) 30 tablet 2  . losartan (COZAAR) 100 MG tablet Take 1 tablet (100 mg total) by mouth daily. (Patient not taking: Reported on 09/12/2017) 30 tablet 2  . losartan-hydrochlorothiazide (HYZAAR) 100-25 MG tablet Take 1 tablet by mouth daily. (Patient not taking: Reported on 02/01/2018) 90 tablet 1   No current facility-administered medications on file prior to visit.     Review of Systems:  As per HPI- otherwise negative. She is not using her xanax although she was tempted to do so recently   Physical Examination: Vitals:   02/01/18 1310  BP: 140/88  Pulse: 96  Resp: 16  SpO2: 97%  Vitals:   02/01/18 1310  Weight: 165 lb (74.8 kg)  Height: 5' (1.524 m)   Body mass index is 32.22 kg/m. Ideal Body Weight: Weight in (lb) to have BMI = 25: 127.7  GEN: WDWN, NAD, Non-toxic, A & O x 3, obese, looks well  HEENT: Atraumatic, Normocephalic. Neck supple. No masses, No LAD.  Both TM are obscured with wax  Ears and Nose: No external deformity. CV: RRR, No M/G/R. No JVD. No thrill. No extra heart sounds. PULM: CTA B, no wheezes, crackles, rhonchi. No retractions. No resp. distress. No accessory muscle use. ABD: S, NT, ND, +BS. No rebound. No HSM. EXTR: No c/c/e NEURO Normal gait.  PSYCH: Normally interactive. Conversant. Not depressed or anxious appearing.   Calm demeanor.   Ears irrigated today - removed wax and then was able to visualize normal TM bilaterally.  She felt much better- hearing better and tinnitus reduced    Assessment and Plan: Need for influenza vaccination - Plan: Flu vaccine HIGH DOSE PF (Fluzone High dose)  Essential hypertension - Plan: amLODipine (NORVASC) 2.5 MG tablet  Screening for diabetes mellitus - Plan: Comprehensive metabolic panel, Hemoglobin A1c  Screening for deficiency anemia - Plan: CBC  Screening for hyperlipidemia - Plan: Lipid panel  Bilateral impacted cerumen  Routine labs today Stop losartan hctz, will put her on amlodipine instead.  Her BP looks ok today off meds for 3 days, so will start on a minimal dose Recheck here in one month for BP check  Flu shot given Cerumen impaction resolved    Signed Abbe Amsterdam, MD received her labs, message to pt   Results for orders placed or performed in visit on 02/01/18  CBC  Result Value Ref Range   WBC 5.6 4.0 - 10.5 K/uL   RBC 4.29 3.87 - 5.11 Mil/uL   Platelets 285.0 150.0 - 400.0 K/uL   Hemoglobin 13.6 12.0 - 15.0 g/dL   HCT 16.1 09.6 - 04.5 %   MCV 90.7 78.0 - 100.0 fl   MCHC 35.1 30.0 - 36.0 g/dL   RDW 40.9 81.1 - 91.4 %  Comprehensive metabolic panel  Result Value Ref Range   Sodium 138 135 - 145 mEq/L   Potassium 4.0 3.5 - 5.1 mEq/L   Chloride 100 96 - 112 mEq/L   CO2 31 19 - 32 mEq/L   Glucose, Bld 100 (H) 70 - 99 mg/dL   BUN 12 6 - 23 mg/dL   Creatinine, Ser 7.82 0.40 - 1.20 mg/dL   Total Bilirubin 0.4 0.2 - 1.2 mg/dL   Alkaline Phosphatase 70 39 - 117 U/L   AST 13 0 - 37 U/L   ALT 13 0 - 35 U/L   Total Protein 7.2 6.0 - 8.3 g/dL   Albumin 4.4 3.5 - 5.2 g/dL   Calcium 9.8 8.4 - 95.6 mg/dL   GFR 21.30 >86.57 mL/min  Hemoglobin A1c  Result Value Ref Range   Hgb A1c MFr Bld 5.6 4.6 - 6.5 %  Lipid panel  Result Value Ref Range   Cholesterol 195 0 - 200 mg/dL   Triglycerides 84.6 0.0 - 149.0 mg/dL   HDL 96.29 >52.84  mg/dL   VLDL 13.2 0.0 - 44.0 mg/dL   LDL Cholesterol 102 (H) 0 - 99 mg/dL   Total CHOL/HDL Ratio 3    NonHDL 138.35

## 2018-02-01 NOTE — Patient Instructions (Signed)
Good to see you today- we will be in touch with your labs Your got your flu shot and your ears cleaned today We will try amlodpine 2.5 mg for your BP in place of the losartan/ hctz Please see me in about one month to check on how your BP looks  Take care!

## 2018-02-09 ENCOUNTER — Encounter: Payer: Self-pay | Admitting: Family Medicine

## 2018-02-26 ENCOUNTER — Other Ambulatory Visit: Payer: Self-pay | Admitting: Family Medicine

## 2018-02-26 DIAGNOSIS — I10 Essential (primary) hypertension: Secondary | ICD-10-CM

## 2018-03-06 NOTE — Progress Notes (Addendum)
[facility] [address] [place], [place] [address] [phone]  Date:  03/07/2018   Name:  Victoria Hogan   DOB:  01/07/1951   MRN:  [id number]  PCP:  Pearline Cables, MD    Chief Complaint: Side Effect of Medication (headache, fatigue, abdominal pain, trouble using the bathroom, pressure-possible side effect of amlodipine 2.5)   History of Present Illness:  [name] is a 67 y.o. very pleasant female patient who presents with the following:  Here today to discuss her BP meds History of osteopenia, HTN., overweight Last seen here in September at which time she had stopped her losartan/hct due to a med recall.  Her BP was not bad off her meds, and she felt better off losartan.   She feels that since she stopped taking her BP meds her depression, "stomach jitters and all the craziness" went away She stopped taking it 3 days ago and feels like she is totally different in a good way.  I am not sure if we can attribute all of this change to stopping her losartan, but we can certainly try a different BP med if she likes  We started amlodipine at 2.5 mg instead at her last visit.  However she then sent me a message regarding feeling sleepy/ having HA, was concerned that this is due to her new BP meds  She notes that she feels sleepy and may have a headache- this may get better as the day went on. She started taking the amlodipine at night and this seemed to resolve her sx  However she does know that she needs to be on medication to control her BP and is willing to try something else or keep going with the amlodipine  Her BP at home has been high as well since she stopped the amlodipine. She checked it a couple of times and got numbers similar to my reading today No CP or SOB She feels very energetic and overall good currently  She has noted a little dysuria a week ago, now improving.   She is not sure if this is anything  of concern   Full labs done in September Lytes, renal function ok then  BP Readings from Last 3 Encounters:  03/07/18 (!) 150/88  02/01/18 140/88  09/12/17 138/82    Patient Active Problem List   Diagnosis Date Noted  . Osteopenia 08/30/2016  . Overweight 08/22/2016  . Hypertension 04/08/2012  . Diverticulitis large intestine 03/23/2012  . Vitamin D deficiency 03/14/2012  . Heart murmur 03/06/2012    Past Medical History:  Diagnosis Date  . Anxiety   . GERD (gastroesophageal reflux disease)   . History of colonic diverticulitis   . Hypertension   . Pneumonia 02/2012    Past Surgical History:  Procedure Laterality Date  . ABDOMINAL HYSTERECTOMY    . bunyonectomy      Social History   Tobacco Use  . Smoking status: Never Smoker  . Smokeless tobacco: Never Used  Substance Use Topics  . Alcohol use: Yes    Comment: 1-3 daily  . Drug use: No    Family History  Problem Relation Age of Onset  . Heart failure Father   . Arthritis Mother     No Known Allergies  Medication list has been reviewed and updated.  Current Outpatient Medications on File Prior to Visit  Medication Sig Dispense Refill  . amLODipine (NORVASC) 2.5  MG tablet Take 1 tablet (2.5 mg total) by mouth daily. (Patient not taking: Reported on 03/07/2018) 30 tablet 6   No current facility-administered medications on file prior to visit.     Review of Systems:  As per HPI- otherwise negative. No fever or chils No Cp or SOB    Physical Examination: Vitals:   03/07/18 1054 03/07/18 1120  BP: (!) 172/92 (!) 150/88  Pulse: 68   Resp: 16   Temp: 98.4 F (36.9 C)   SpO2: 98%    Vitals:   03/07/18 1054  Weight: 159 lb (72.1 kg)  Height: 5' (1.524 m)   Body mass index is 31.05 kg/m. Ideal Body Weight: Weight in (lb) to have BMI = 25: 127.7  GEN: WDWN, NAD, Non-toxic, A & O x 3, mild obesity, looks well HEENT: Atraumatic, Normocephalic. Neck supple. No masses, No LAD.   Ears and  Nose: No external deformity. CV: RRR, No M/G/R. No JVD. No thrill. No extra heart sounds. PULM: CTA B, no wheezes, crackles, rhonchi. No retractions. No resp. distress. No accessory muscle use. EXTR: No c/c/e NEURO Normal gait.  PSYCH: Normally interactive. Conversant. Not depressed or anxious appearing.  Calm demeanor.   Pt could not give a urine sample today. She is given a cup to take home  Assessment and Plan: Essential hypertension - Plan: hydrochlorothiazide (HYDRODIURIL) 12.5 MG tablet  Dysuria - Plan: Urine Culture, POCT urinalysis dipstick, CANCELED: Urine Culture, CANCELED: POCT urinalysis dipstick  Overweight  Following up today Her BP is too high off meds Will try plain HCTZ and see if this causes any sx for her She will monitor her BP at home and let me know how she does Plan to visit in the office in 2-3 months to check labs Given a urine cup to take home- she will bring in a sample if she continues to have any dysuria   Signed Abbe Amsterdam, MD  Received her urine dip- culture spending 10/31

## 2018-03-07 ENCOUNTER — Encounter: Payer: Self-pay | Admitting: Family Medicine

## 2018-03-07 ENCOUNTER — Ambulatory Visit: Payer: Medicare Other | Admitting: Family Medicine

## 2018-03-07 VITALS — BP 150/88 | HR 68 | Temp 98.4°F | Resp 16 | Ht 60.0 in | Wt 159.0 lb

## 2018-03-07 DIAGNOSIS — I10 Essential (primary) hypertension: Secondary | ICD-10-CM | POA: Diagnosis not present

## 2018-03-07 DIAGNOSIS — E663 Overweight: Secondary | ICD-10-CM | POA: Diagnosis not present

## 2018-03-07 DIAGNOSIS — R300 Dysuria: Secondary | ICD-10-CM

## 2018-03-07 MED ORDER — HYDROCHLOROTHIAZIDE 12.5 MG PO TABS: 12.5000 mg | ORAL_TABLET | Freq: Every day | ORAL | 5 refills | Status: DC

## 2018-03-07 NOTE — Patient Instructions (Addendum)
We will try hctz 12.5 mg daily for your BP Please check your BP while you are taking this med- I would like to see you less than 140/90  If you continue to have any urinary symptoms please bring in a sample for [place] to test   Please see me in 2-3 months to check on labs and BP

## 2018-03-08 ENCOUNTER — Other Ambulatory Visit (INDEPENDENT_AMBULATORY_CARE_PROVIDER_SITE_OTHER): Payer: Medicare Other

## 2018-03-08 DIAGNOSIS — R300 Dysuria: Secondary | ICD-10-CM | POA: Diagnosis not present

## 2018-03-08 LAB — POCT URINALYSIS DIP (MANUAL ENTRY)
Bilirubin, UA: NEGATIVE
Blood, UA: NEGATIVE
GLUCOSE UA: NEGATIVE mg/dL
LEUKOCYTES UA: NEGATIVE
Nitrite, UA: NEGATIVE
PROTEIN UA: NEGATIVE mg/dL
SPEC GRAV UA: 1.015 (ref 1.010–1.025)
UROBILINOGEN UA: 0.2 U/dL
pH, UA: 6 (ref 5.0–8.0)

## 2018-03-09 LAB — URINE CULTURE
MICRO NUMBER:: 91311744
Result:: NO GROWTH
SPECIMEN QUALITY:: ADEQUATE

## 2018-03-10 ENCOUNTER — Encounter: Payer: Self-pay | Admitting: Family Medicine

## 2018-03-10 NOTE — Progress Notes (Signed)
Received her urine as below  Results for orders placed or performed in visit on 03/08/18  Urine Culture  Result Value Ref Range   MICRO NUMBER: [id number]    SPECIMEN QUALITY: ADEQUATE    Sample Source URINE    STATUS: FINAL    Result: No Growth   POCT urinalysis dipstick  Result Value Ref Range   Color, UA yellow yellow   Clarity, UA clear clear   Glucose, UA negative negative mg/dL   Bilirubin, UA negative negative   Ketones, POC UA trace (5) (A) negative mg/dL   Spec Grav, UA 6.295 2.841 - 1.025   Blood, UA negative negative   pH, UA 6.0 5.0 - 8.0   Protein Ur, POC negative negative mg/dL   Urobilinogen, UA 0.2 0.2 or 1.0 E.U./dL   Nitrite, UA Negative Negative   Leukocytes, UA Negative Negative   Message to pt

## 2018-03-11 ENCOUNTER — Ambulatory Visit (HOSPITAL_COMMUNITY)
Admission: EM | Admit: 2018-03-11 | Discharge: 2018-03-11 | Disposition: A | Discharge status: Home / Self Care | Payer: Medicare Other

## 2018-03-11 ENCOUNTER — Encounter (HOSPITAL_COMMUNITY): Payer: Self-pay | Admitting: *Deleted

## 2018-03-11 ENCOUNTER — Other Ambulatory Visit: Payer: Self-pay

## 2018-03-11 DIAGNOSIS — R1030 Lower abdominal pain, unspecified: Secondary | ICD-10-CM

## 2018-03-11 HISTORY — DX: Unspecified hemorrhoids: K64.9

## 2018-03-11 MED ORDER — AMOXICILLIN-POT CLAVULANATE 875-125 MG PO TABS: 1.0000 | ORAL_TABLET | Freq: Two times a day (BID) | ORAL | 0 refills | Status: DC

## 2018-03-11 NOTE — ED Provider Notes (Signed)
03/11/2018 11:44 AM   DOB: [birth year] / MRN: [id number]  SUBJECTIVE:  Victoria Hogan is a 67 y.o. female presenting for lower abdominal pain that started about 1 week ago.  Assoicates some relief of pain with urination.  Denies constipation, abnormal rectal bleeding, vaginal symtpom, fever, chest pain, SOB, dizziness, nausea, GERD.  Has tried seeing her PCP where her urine was normal    She has No Known Allergies.   She  has a past medical history of Anxiety, GERD (gastroesophageal reflux disease), Hemorrhoids, History of colonic diverticulitis, Hypertension, and Pneumonia (02/2012).    She  reports that she has never smoked. She has never used smokeless tobacco. She reports that she drinks alcohol. She reports that she does not use drugs. She  reports that she does not currently engage in sexual activity. The patient  has a past surgical history that includes bunyonectomy and Abdominal hysterectomy.  Her family history includes Arthritis in her mother; Heart failure in her father.  ROS Per HPI  OBJECTIVE:  BP (!) 162/91   Pulse 96   Temp 97.9 F (36.6 C) (Oral)   Resp 18   SpO2 100%   Wt Readings from Last 3 Encounters:  03/07/18 159 lb (72.1 kg)  02/01/18 165 lb (74.8 kg)  09/12/17 162 lb 6.4 oz (73.7 kg)   Temp Readings from Last 3 Encounters:  03/11/18 97.9 F (36.6 C) (Oral)  03/07/18 98.4 F (36.9 C) (Oral)  05/04/17 98.7 F (37.1 C) (Oral)   BP Readings from Last 3 Encounters:  03/11/18 (!) 162/91  03/07/18 (!) 150/88  02/01/18 140/88   Pulse Readings from Last 3 Encounters:  03/11/18 96  03/07/18 68  02/01/18 96    Physical Exam  Constitutional: She is oriented to person, place, and time. She appears well-nourished. No distress.  Eyes: Pupils are equal, round, and reactive to light. EOM are normal.  Cardiovascular: Normal rate, regular rhythm, S1 normal, S2 normal, normal heart sounds and intact distal pulses. Exam reveals no gallop, no friction rub and no  decreased pulses.  No murmur heard. Pulmonary/Chest: Effort normal. No stridor. No respiratory distress. She has no wheezes. She has no rales.  Abdominal: She exhibits no distension.  Musculoskeletal: She exhibits no edema.  Neurological: She is alert and oriented to person, place, and time. No cranial nerve deficit. Gait normal.  Skin: Skin is dry. She is not diaphoretic.  Psychiatric: She has a normal mood and affect.  Vitals reviewed.   Results for orders placed or performed in visit on 03/08/18 (from the past 72 hour(s))  Urine Culture     Status: None   Collection Time: 03/08/18 12:28 PM  Result Value Ref Range   MICRO NUMBER: [id number]    SPECIMEN QUALITY: ADEQUATE    Sample Source URINE    STATUS: FINAL    Result: No Growth   POCT urinalysis dipstick     Status: Abnormal   Collection Time: 03/08/18  2:10 PM  Result Value Ref Range   Color, UA yellow yellow   Clarity, UA clear clear   Glucose, UA negative negative mg/dL   Bilirubin, UA negative negative   Ketones, POC UA trace (5) (A) negative mg/dL   Spec Grav, UA 0.865 7.846 - 1.025   Blood, UA negative negative   pH, UA 6.0 5.0 - 8.0   Protein Ur, POC negative negative mg/dL   Urobilinogen, UA 0.2 0.2 or 1.0 E.U./dL   Nitrite, UA Negative Negative   Leukocytes, UA  Negative Negative    No results found.  ASSESSMENT AND PLAN:   Lower abdominal pain: History of diverticulitis on scan in 2013.  Could be an occult UTI as well.  Starting augmentin with close pcp follow up.  ED precautions discussed.  No toxic today.  Belly remains soft but tender in lower quadrants.     Discharge Instructions     Please follow up with Dr. Patsy Lager next week.  Please go to the ED if you are getting worse despite taking medication.         The patient is advised to call or return to clinic if she does not see an improvement in symptoms, or to seek the care of the closest emergency department if she worsens with the above plan.    Deliah Boston, MHS, PA-C 03/11/2018 11:44 AM   Ofilia Neas, PA-C 03/11/18 1146

## 2018-03-11 NOTE — ED Notes (Signed)
Discharged by provider, no urine specimen in department.  Deliah Boston, pa notified no urine obtained, order to cancel urine

## 2018-03-11 NOTE — Discharge Instructions (Signed)
Please follow up with Dr. Patsy Lager next week.  Please go to the ED if you are getting worse despite taking medication.

## 2018-03-11 NOTE — ED Triage Notes (Signed)
C/O suprapubic pain x 1 wk with progressive worsening at night; at night having bilat side pain as well.  Saw PCP last wk - was told urine was clear.  Denies n/v/d, denies fevers.

## 2018-03-27 NOTE — Progress Notes (Addendum)
[facility] [address] [place], [place] [address] [phone]  Date:  03/28/2018   Name:  Victoria Hogan   DOB:  09-01-1950   MRN:  [id number]  PCP:  Pearline Cables, MD    Chief Complaint: Abdominal Pain (diverticulitis, taken antibioitcs and probioitcs, no improvement, lower abdominal mostly on left side)   History of Present Illness:  Victoria Hogan is a 67 y.o. very pleasant female patient who presents with the following: Seen by myself at the end of October: Her BP is too high off meds Will try plain HCTZ and see if this causes any sx for her She will monitor her BP at home and let me know how she does Plan to visit in the office in 2-3 months to check labs Given a urine cup to take home- she will bring in a sample if she continues to have any dysuria   Here today with concern of stomach pain.  History of diverticulitis years ago, HTN She was seen at [facility] on 11/3 with possible diverticulitis and was given Augmentin. Did not do a CT at this time  (CT scan in chart from 2013 shows diverticulosis, possible diverticulitis) After 3 or 4 days of abx and bowel rest she felt better. However, over this past weekend she noted return of sx with LLQ pain and pain with urination She used bowel rest again and felt better as of the last 2 days- she ate yesterday and did ok,no pain No further urinary sx   Urine culture on 10/31 was negative Between 2013 and now she has not had diverticulitis sx  No vomiting  No fever No constipation   Today she is feeling well.  Except for these couple of ?diverticulitis flares she has felt quite well and is exercising, using a new diet and losing weight,  She is really pleased   She has been eating more nuts recently as part of her healthier diet   Wt Readings from Last 3 Encounters:  03/28/18 151 lb (68.5 kg)  03/07/18 159 lb (72.1 kg)  02/01/18 165 lb (74.8 kg)     BP Readings  from Last 3 Encounters:  03/28/18 140/80  03/11/18 (!) 162/91  03/07/18 (!) 150/88   She is tolerating HCTZ just fine and BP looks better    Patient Active Problem List   Diagnosis Date Noted  . Osteopenia 08/30/2016  . Overweight 08/22/2016  . Hypertension 04/08/2012  . Diverticulitis large intestine 03/23/2012  . Vitamin D deficiency 03/14/2012  . Heart murmur 03/06/2012    Past Medical History:  Diagnosis Date  . Anxiety   . GERD (gastroesophageal reflux disease)   . Hemorrhoids   . History of colonic diverticulitis   . Hypertension   . Pneumonia 02/2012    Past Surgical History:  Procedure Laterality Date  . ABDOMINAL HYSTERECTOMY    . bunyonectomy      Social History   Tobacco Use  . Smoking status: Never Smoker  . Smokeless tobacco: Never Used  Substance Use Topics  . Alcohol use: Yes    Comment: occasionally  . Drug use: Never    Family History  Problem Relation Age of Onset  . Heart failure Father   . Arthritis Mother     No Known Allergies  Medication list has been reviewed and updated.  Current Outpatient Medications on File Prior to Visit  Medication Sig Dispense Refill  .  hydrochlorothiazide (HYDRODIURIL) 12.5 MG tablet Take 1 tablet (12.5 mg total) by mouth daily. 30 tablet 5   No current facility-administered medications on file prior to visit.     Review of Systems:  As per HPI- otherwise negative. Mood is good, no depression sx per her report  No urinary sx    Physical Examination: Vitals:   03/28/18 1017  BP: 140/80  Pulse: 95  Resp: 16  Temp: (!) 97.5 F (36.4 C)  SpO2: 98%   Vitals:   03/28/18 1017  Weight: 151 lb (68.5 kg)  Height: 5' (1.524 m)   Body mass index is 29.49 kg/m. Ideal Body Weight: Weight in (lb) to have BMI = 25: 127.7  GEN: WDWN, NAD, Non-toxic, A & O x 3, overweight, looks well  HEENT: Atraumatic, Normocephalic. Neck supple. No masses, No LAD. Bilateral TM wnl, oropharynx normal.  PEERL,EOMI.    Ears and Nose: No external deformity. CV: RRR, No M/G/R. No JVD. No thrill. No extra heart sounds. PULM: CTA B, no wheezes, crackles, rhonchi. No retractions. No resp. distress. No accessory muscle use. ABD: S, NT, ND, +BS. No rebound. No HSM. Benign belly  EXTR: No c/c/e NEURO Normal gait.  PSYCH: Normally interactive. Conversant. Not depressed or anxious appearing.  Calm demeanor.    Assessment and Plan: LLQ pain - Plan: Basic metabolic panel, CBC  Essential hypertension  Possible recent diverticulitis flare  Ordered labs for her today If her sx return she will contact me and we will do a CT scan asap Advised to avoid eating nuts for a few weeks- then can try to add back and see if she gets any sx BP is under fine control Check BMP   Signed Abbe AmsterdamJessica Jams Trickett, MD  Received her labs 11/20- message to pt  Results for orders placed or performed in visit on 03/28/18  Basic metabolic panel  Result Value Ref Range   Sodium 138 135 - 145 mEq/L   Potassium 4.2 3.5 - 5.1 mEq/L   Chloride 100 96 - 112 mEq/L   CO2 29 19 - 32 mEq/L   Glucose, Bld 104 (H) 70 - 99 mg/dL   BUN 8 6 - 23 mg/dL   Creatinine, Ser 1.610.65 0.40 - 1.20 mg/dL   Calcium 9.5 8.4 - 09.610.5 mg/dL   GFR 045.40116.73 >98.11>60.00 mL/min  CBC  Result Value Ref Range   WBC 3.7 (L) 4.0 - 10.5 K/uL   RBC 4.22 3.87 - 5.11 Mil/uL   Platelets 402.0 (H) 150.0 - 400.0 K/uL   Hemoglobin 12.9 12.0 - 15.0 g/dL   HCT 91.437.5 78.236.0 - 95.646.0 %   MCV 88.9 78.0 - 100.0 fl   MCHC 34.4 30.0 - 36.0 g/dL   RDW 21.312.1 08.611.5 - 57.815.5 %

## 2018-03-28 ENCOUNTER — Ambulatory Visit: Payer: Medicare Other | Admitting: Family Medicine

## 2018-03-28 ENCOUNTER — Encounter: Payer: Self-pay | Admitting: Family Medicine

## 2018-03-28 VITALS — BP 140/80 | HR 95 | Temp 97.5°F | Resp 16 | Ht 60.0 in | Wt 151.0 lb

## 2018-03-28 DIAGNOSIS — R1032 Left lower quadrant pain: Secondary | ICD-10-CM

## 2018-03-28 DIAGNOSIS — I10 Essential (primary) hypertension: Secondary | ICD-10-CM | POA: Diagnosis not present

## 2018-03-28 LAB — CBC
HEMATOCRIT: 37.5 % (ref 36.0–46.0)
HEMOGLOBIN: 12.9 g/dL (ref 12.0–15.0)
MCHC: 34.4 g/dL (ref 30.0–36.0)
MCV: 88.9 fl (ref 78.0–100.0)
PLATELETS: 402 10*3/uL — AB (ref 150.0–400.0)
RBC: 4.22 Mil/uL (ref 3.87–5.11)
RDW: 12.1 % (ref 11.5–15.5)
WBC: 3.7 10*3/uL — ABNORMAL LOW (ref 4.0–10.5)

## 2018-03-28 LAB — BASIC METABOLIC PANEL
BUN: 8 mg/dL (ref 6–23)
CHLORIDE: 100 meq/L (ref 96–112)
CO2: 29 meq/L (ref 19–32)
Calcium: 9.5 mg/dL (ref 8.4–10.5)
Creatinine, Ser: 0.65 mg/dL (ref 0.40–1.20)
GFR: 116.73 mL/min (ref >60.00)
GLUCOSE: 104 mg/dL — AB (ref 70–99)
Potassium: 4.2 mEq/L (ref 3.5–5.1)
SODIUM: 138 meq/L (ref 135–145)

## 2018-03-28 NOTE — Patient Instructions (Signed)
Good to see you again today It sounds like you may be having diverticulitis flares ups. If this happens again, put yourself on a soft or liquid diet but also alert me so we can get a CT scan! Your BP looks good- continue current medication Great job with weight loss I will be in touch with your labs from today

## 2018-07-02 ENCOUNTER — Other Ambulatory Visit: Payer: Self-pay | Admitting: Family Medicine

## 2018-07-02 DIAGNOSIS — I10 Essential (primary) hypertension: Secondary | ICD-10-CM

## 2018-09-13 NOTE — Progress Notes (Signed)
Virtual Visit via Video Note  I connected with patient on 09/14/18 at  1:00 PM EDT by a video enabled telemedicine application and verified that I am speaking with the correct person using two identifiers.   THIS ENCOUNTER IS A VIRTUAL VISIT DUE TO COVID-19 - PATIENT WAS NOT SEEN IN THE OFFICE. PATIENT HAS CONSENTED TO VIRTUAL VISIT / TELEMEDICINE VISIT   Location of patient: home  Location of provider: office  I discussed the limitations of evaluation and management by telemedicine and the availability of in person appointments. The patient expressed understanding and agreed to proceed.   Subjective:   Victoria Hogan is a 68 y.o. female who presents for Medicare Annual (Subsequent) preventive examination.  Enjoys making bows.  Review of Systems: No ROS.  Medicare Wellness Virtual Visit.  Visual/audio telehealth visit, UTA vital signs.   See social history for additional risk factors. Cardiac Risk Factors include: advanced age (>61men, >4 women);hypertension Sleep patterns: no issues Home Safety/Smoke Alarms: Feels safe in home. Smoke alarms in place.  Lives in 1 story home. Son lives near by and checks on her daily. Walk in shower.  Female:   Pap- hysterectomy       Mammo-  09/20/17     Dexa scan-  08/29/16      CCS- last 2017 per pt Pt will schedule screenings after pandemic.    Objective:     Vitals: Unable to assess. This visit is enabled though telemedicine due to Covid 19.   Advanced Directives 09/14/2018 09/12/2017 03/06/2012  Does Patient Have a Medical Advance Directive? No No Patient does not have advance directive  Would patient like information on creating a medical advance directive? No - Patient declined Yes (MAU/Ambulatory/Procedural Areas - Information given) -  Pre-existing out of facility DNR order (yellow form or pink MOST form) - - No    Tobacco Social History   Tobacco Use  Smoking Status Never Smoker  Smokeless Tobacco Never Used     Counseling  given: Not Answered   Clinical Intake:     Pain : No/denies pain    Past Medical History:  Diagnosis Date  . Anxiety   . GERD (gastroesophageal reflux disease)   . Hemorrhoids   . History of colonic diverticulitis   . Hypertension   . Pneumonia 02/2012   Past Surgical History:  Procedure Laterality Date  . ABDOMINAL HYSTERECTOMY    . bunyonectomy     Family History  Problem Relation Age of Onset  . Heart failure Father   . Arthritis Mother    Social History   Socioeconomic History  . Marital status: Divorced    Spouse name: Not on file  . Number of children: Not on file  . Years of education: college  . Highest education level: Not on file  Occupational History  . Occupation: Magazine features editor: [employer]  Social Needs  . Financial resource strain: Not on file  . Food insecurity:    Worry: Not on file    Inability: Not on file  . Transportation needs:    Medical: Not on file    Non-medical: Not on file  Tobacco Use  . Smoking status: Never Smoker  . Smokeless tobacco: Never Used  Substance and Sexual Activity  . Alcohol use: Yes    Comment: occasionally  . Drug use: Never  . Sexual activity: Not Currently  Lifestyle  . Physical activity:    Days per week: Not on file  Minutes per session: Not on file  . Stress: Not on file  Relationships  . Social connections:    Talks on phone: Not on file    Gets together: Not on file    Attends religious service: Not on file    Active member of club or organization: Not on file    Attends meetings of clubs or organizations: Not on file    Relationship status: Not on file  Other Topics Concern  . Not on file  Social History Narrative   Bikes daily for exercise.    Outpatient Encounter Medications as of 09/14/2018  Medication Sig  . amLODipine (NORVASC) 2.5 MG tablet Take 2.5 mg by mouth daily.  . hydrochlorothiazide (HYDRODIURIL) 12.5 MG tablet TAKE 1 TABLET BY MOUTH EVERY DAY   No  facility-administered encounter medications on file as of 09/14/2018.     Activities of Daily Living In your present state of health, do you have any difficulty performing the following activities: 09/14/2018  Hearing? N  Vision? N  Difficulty concentrating or making decisions? N  Walking or climbing stairs? N  Dressing or bathing? N  Doing errands, shopping? N  Preparing Food and eating ? N  Using the Toilet? N  In the past six months, have you accidently leaked urine? N  Do you have problems with loss of bowel control? N  Managing your Medications? N  Managing your Finances? N  Housekeeping or managing your Housekeeping? N  Some recent data might be hidden    Patient Care Team: Copland, Victoria FoundJessica C, MD as PCP - General (Family Medicine) Jeani HawkingHung, Patrick, MD as Attending Physician (Gastroenterology)    Assessment:   This is a routine wellness examination for Victoria Hogan. Physical assessment deferred to PCP.  Exercise Activities and Dietary recommendations Current Exercise Habits: Home exercise routine, Type of exercise: walking, Time (Minutes): 15, Frequency (Times/Week): 2, Weekly Exercise (Minutes/Week): 30, Intensity: Mild, Exercise limited by: None identified Diet (meal preparation, eat out, water intake, caffeinated beverages, dairy products, fruits and vegetables): well balanced, on average, 2 meals per day. Drinks water all through the day 3-6 glasses.    Goals    . Continue to keep a positive perspective       Fall Risk Fall Risk  09/14/2018 09/12/2017 10/22/2015 10/20/2015 02/09/2015  Falls in the past year? 0 No No No Yes  Number falls in past yr: - - - - 1  Comment - - - - -  Injury with Fall? - - - - -    Depression Screen PHQ 2/9 Scores 09/14/2018 09/12/2017 10/22/2015 10/20/2015  PHQ - 2 Score 0 0 0 0  PHQ- 9 Score - - - -     Cognitive Function Ad8 score reviewed for issues:  Issues making decisions:no  Less interest in hobbies / activities:no  Repeats questions, stories  (family complaining):no  Trouble using ordinary gadgets (microwave, computer, phone):no  Forgets the month or year: no  Mismanaging finances: no  Remembering appts:no  Daily problems with thinking and/or memory:no Ad8 score is=0          Immunization History  Administered Date(s) Administered  . Influenza Split 03/07/2012  . Influenza, High Dose Seasonal PF 02/01/2018  . Influenza,inj,Quad PF,6+ Mos 04/10/2013, 02/21/2014, 02/09/2015  . Pneumococcal Conjugate-13 08/22/2016  . Pneumococcal Polysaccharide-23 03/07/2012  . Tdap 12/05/2012   Screening Tests Health Maintenance  Topic Date Due  . COLONOSCOPY  10/14/2018  . INFLUENZA VACCINE  12/08/2018  . MAMMOGRAM  09/21/2019  . TETANUS/TDAP  12/06/2022  . DEXA SCAN  Completed  . Hepatitis Hogan Screening  Completed  . PNA vac Low Risk Adult  Completed      Plan:   See you next year!  Continue to eat heart healthy diet (full of fruits, vegetables, whole grains, lean protein, water--limit salt, fat, and sugar intake) and increase physical activity as tolerated.  Continue doing brain stimulating activities (puzzles, reading, adult coloring books, staying active) to keep memory sharp.   Bring a copy of your living will and/or healthcare power of attorney to your next office visit.   I have personally reviewed and noted the following in the patient's chart:   . Medical and social history . Use of alcohol, tobacco or illicit drugs  . Current medications and supplements . Functional ability and status . Nutritional status . Physical activity . Advanced directives . List of other physicians . Hospitalizations, surgeries, and ER visits in previous 12 months . Vitals . Screenings to include cognitive, depression, and falls . Referrals and appointments  In addition, I have reviewed and discussed with patient certain preventive protocols, quality metrics, and best practice recommendations. A written personalized care plan  for preventive services as well as general preventive health recommendations were provided to patient.     Avon Gully, [place]  09/14/2018

## 2018-09-14 ENCOUNTER — Encounter: Payer: Self-pay | Admitting: *Deleted

## 2018-09-14 ENCOUNTER — Ambulatory Visit (INDEPENDENT_AMBULATORY_CARE_PROVIDER_SITE_OTHER): Payer: Medicare Other | Admitting: *Deleted

## 2018-09-14 ENCOUNTER — Other Ambulatory Visit: Payer: Self-pay

## 2018-09-14 DIAGNOSIS — Z0000 Encounter for general adult medical examination without abnormal findings: Secondary | ICD-10-CM

## 2018-09-14 NOTE — Patient Instructions (Signed)
See you next year!  Continue to eat heart healthy diet (full of fruits, vegetables, whole grains, lean protein, water--limit salt, fat, and sugar intake) and increase physical activity as tolerated.  Continue doing brain stimulating activities (puzzles, reading, adult coloring books, staying active) to keep memory sharp.   Bring a copy of your living will and/or healthcare power of attorney to your next office visit.   Ms. Victoria Hogan , Thank you for taking time to come for your Medicare Wellness Visit. I appreciate your ongoing commitment to your health goals. Please review the following plan we discussed and let me know if I can assist you in the future.   These are the goals we discussed: Goals    . Continue to keep a positive perspective       This is a list of the screening recommended for you and due dates:  Health Maintenance  Topic Date Due  . Colon Cancer Screening  10/14/2018  . Flu Shot  12/08/2018  . Mammogram  09/21/2019  . Tetanus Vaccine  12/06/2022  . DEXA scan (bone density measurement)  Completed  .  Hepatitis C: One time screening is recommended by Center for Disease Control  (CDC) for  adults born from [birth year] through 1965.   Completed  . Pneumonia vaccines  Completed    Health Maintenance After Age 68 After age 68, you are at a higher risk for certain long-term diseases and infections as well as injuries from falls. Falls are a major cause of broken bones and head injuries in people who are older than age 68. Getting regular preventive care can help to keep you healthy and well. Preventive care includes getting regular testing and making lifestyle changes as recommended by your health care provider. Talk with your health care provider about:  Which screenings and tests you should have. A screening is a test that checks for a disease when you have no symptoms.  A diet and exercise plan that is right for you. What should I know about screenings and tests to prevent  falls? Screening and testing are the best ways to find a health problem early. Early diagnosis and treatment give you the best chance of managing medical conditions that are common after age 68. Certain conditions and lifestyle choices may make you more likely to have a fall. Your health care provider may recommend:  Regular vision checks. Poor vision and conditions such as cataracts can make you more likely to have a fall. If you wear glasses, make sure to get your prescription updated if your vision changes.  Medicine review. Work with your health care provider to regularly review all of the medicines you are taking, including over-the-counter medicines. Ask your health care provider about any side effects that may make you more likely to have a fall. Tell your health care provider if any medicines that you take make you feel dizzy or sleepy.  Osteoporosis screening. Osteoporosis is a condition that causes the bones to get weaker. This can make the bones weak and cause them to break more easily.  Blood pressure screening. Blood pressure changes and medicines to control blood pressure can make you feel dizzy.  Strength and balance checks. Your health care provider may recommend certain tests to check your strength and balance while standing, walking, or changing positions.  Foot health exam. Foot pain and numbness, as well as not wearing proper footwear, can make you more likely to have a fall.  Depression screening. You may be more  likely to have a fall if you have a fear of falling, feel emotionally low, or feel unable to do activities that you used to do.  Alcohol use screening. Using too much alcohol can affect your balance and may make you more likely to have a fall. What actions can I take to lower my risk of falls? General instructions  Talk with your health care provider about your risks for falling. Tell your health care provider if: ? You fall. Be sure to tell your health care  provider about all falls, even ones that seem minor. ? You feel dizzy, sleepy, or off-balance.  Take over-the-counter and prescription medicines only as told by your health care provider. These include any supplements.  Eat a healthy diet and maintain a healthy weight. A healthy diet includes low-fat dairy products, low-fat (lean) meats, and fiber from whole grains, beans, and lots of fruits and vegetables. Home safety  Remove any tripping hazards, such as rugs, cords, and clutter.  Install safety equipment such as grab bars in bathrooms and safety rails on stairs.  Keep rooms and walkways well-lit. Activity   Follow a regular exercise program to stay fit. This will help you maintain your balance. Ask your health care provider what types of exercise are appropriate for you.  If you need a cane or walker, use it as recommended by your health care provider.  Wear supportive shoes that have nonskid soles. Lifestyle  Do not drink alcohol if your health care provider tells you not to drink.  If you drink alcohol, limit how much you have: ? 0-1 drink a day for women. ? 0-2 drinks a day for men.  Be aware of how much alcohol is in your drink. In the [place] one drink equals one typical bottle of beer (12 oz), one-half glass of wine (5 oz), or one shot of hard liquor (1 oz).  Do not use any products that contain nicotine or tobacco, such as cigarettes and e-cigarettes. If you need help quitting, ask your health care provider. Summary  Having a healthy lifestyle and getting preventive care can help to protect your health and wellness after age 1.  Screening and testing are the best way to find a health problem early and help you avoid having a fall. Early diagnosis and treatment give you the best chance for managing medical conditions that are more common for people who are older than age 48.  Falls are a major cause of broken bones and head injuries in people who are older than age 5.  Take precautions to prevent a fall at home.  Work with your health care provider to learn what changes you can make to improve your health and wellness and to prevent falls. This information is not intended to replace advice given to you by your health care provider. Make sure you discuss any questions you have with your health care provider. Document Released: 03/08/2017 Document Revised: 03/08/2017 Document Reviewed: 03/08/2017 Elsevier Interactive Patient Education  2019 [employer].

## 2018-09-14 NOTE — Progress Notes (Signed)
I have reviewed the above MWE and agree Arthor Captain MD

## 2018-10-23 ENCOUNTER — Encounter: Payer: Self-pay | Admitting: Family Medicine

## 2018-10-23 MED ORDER — AMLODIPINE BESYLATE 2.5 MG PO TABS: 2.5000 mg | ORAL_TABLET | Freq: Every day | ORAL | 3 refills | Status: DC

## 2018-10-24 ENCOUNTER — Ambulatory Visit: Payer: Medicare Other | Admitting: Family Medicine

## 2018-10-29 ENCOUNTER — Encounter: Payer: Self-pay | Admitting: Family Medicine

## 2018-10-29 ENCOUNTER — Other Ambulatory Visit: Payer: Self-pay

## 2018-10-29 ENCOUNTER — Telehealth: Payer: Self-pay | Admitting: Gastroenterology

## 2018-10-29 ENCOUNTER — Ambulatory Visit: Payer: Medicare Other | Admitting: Family Medicine

## 2018-10-29 VITALS — BP 170/90 | HR 75 | Temp 98.4°F | Resp 16 | Ht 60.0 in | Wt 153.0 lb

## 2018-10-29 DIAGNOSIS — I10 Essential (primary) hypertension: Secondary | ICD-10-CM

## 2018-10-29 DIAGNOSIS — Z131 Encounter for screening for diabetes mellitus: Secondary | ICD-10-CM | POA: Diagnosis not present

## 2018-10-29 DIAGNOSIS — Z1211 Encounter for screening for malignant neoplasm of colon: Secondary | ICD-10-CM

## 2018-10-29 DIAGNOSIS — Z130 Encounter for screening for diseases of the blood and blood-forming organs and certain disorders involving the immune mechanism: Secondary | ICD-10-CM

## 2018-10-29 DIAGNOSIS — Z13220 Encounter for screening for lipoid disorders: Secondary | ICD-10-CM | POA: Diagnosis not present

## 2018-10-29 MED ORDER — AMLODIPINE BESYLATE 10 MG PO TABS: 5.0000 mg | ORAL_TABLET | Freq: Every day | ORAL | 6 refills | Status: DC

## 2018-10-29 NOTE — Progress Notes (Addendum)
[facility] [address], [address] [place], [place] [address] [phone] [phone]  Date:  10/29/2018   Name:  Victoria Hogan   DOB:  06/17/1950   MRN:  [id number]  PCP:  Pearline Cablesopland, Jessica C, MD    Chief Complaint: Hypertension (discuss blood pressure meds)   History of Present Illness:  Victoria Hogan is a 68 y.o. very pleasant female patient who presents with the following:  In person visit today to discuss BP treatment I last saw Victoria Hogan in the office in November for an ER follow-up visit- at that time her BP looked ok on HCTZ and amlodipine  She is taking amlodipine 2.5 mg- ONLY She has not been taking her HCTZ- stopped taking this around our last visit -she thought it was no longer necessary.  She has been checking her BP at home and it has been running too high recently- for example 165/nearly 90.  She is not able to go to the gym, and this seems to be causing her BP to go up   No CP or SOB No unusual HA  Colon cancer screening appears to be due- Dr. Elnoria HowardHung, she likes him but he is not in her network.  Would like a referral to a [facility]  Recommended that she have Shingrix given to her drugstore Labs:can do today   No significant depression sx during pandemic-"nothing I can't shake off"  Wt Readings from Last 3 Encounters:  10/29/18 153 lb (69.4 kg)  03/28/18 151 lb (68.5 kg)  03/07/18 159 lb (72.1 kg)     Patient Active Problem List   Diagnosis Date Noted  . Osteopenia 08/30/2016  . Overweight 08/22/2016  . Hypertension 04/08/2012  . Diverticulitis large intestine 03/23/2012  . Vitamin D deficiency 03/14/2012  . Heart murmur 03/06/2012    Past Medical History:  Diagnosis Date  . Anxiety   . GERD (gastroesophageal reflux disease)   . Hemorrhoids   . History of colonic diverticulitis   . Hypertension   . Pneumonia 02/2012    Past Surgical History:  Procedure Laterality Date  . ABDOMINAL HYSTERECTOMY    .  bunyonectomy      Social History   Tobacco Use  . Smoking status: Never Smoker  . Smokeless tobacco: Never Used  Substance Use Topics  . Alcohol use: Yes    Comment: occasionally  . Drug use: Never    Family History  Problem Relation Age of Onset  . Heart failure Father   . Arthritis Mother     No Known Allergies  Medication list has been reviewed and updated.  Current Outpatient Medications on File Prior to Visit  Medication Sig Dispense Refill  . amLODipine (NORVASC) 2.5 MG tablet Take 1 tablet (2.5 mg total) by mouth daily. 30 tablet 3  . hydrochlorothiazide (HYDRODIURIL) 12.5 MG tablet TAKE 1 TABLET BY MOUTH EVERY DAY (Patient not taking: Reported on 10/29/2018) 90 tablet 1   No current facility-administered medications on file prior to visit.     Review of Systems:  As per HPI- otherwise negative. No fever chills, no chest pain or shortness of breath  Physical Examination: Vitals:   10/29/18 1414  BP: (!) 170/92  Pulse: (!) 110  Resp: 16  Temp: 98.4 F (36.9 C)  SpO2: 98%   Vitals:   10/29/18 1414  Weight: 153 lb (69.4 kg)  Height: 5' (1.524 m)   Body mass index is 29.88 kg/m. Ideal  Body Weight: Weight in (lb) to have BMI = 25: 127.7  GEN: WDWN, NAD, Non-toxic, A & O x 3, overweight, looks well HEENT: Atraumatic, Normocephalic. Neck supple. No masses, No LAD. Ears and Nose: No external deformity. CV: RRR, No M/G/R. No JVD. No thrill. No extra heart sounds. PULM: CTA B, no wheezes, crackles, rhonchi. No retractions. No resp. distress. No accessory muscle use. EXTR: No c/c/e NEURO Normal gait.  PSYCH: Normally interactive. Conversant. Not depressed or anxious appearing.  Calm demeanor.    Assessment and Plan:   ICD-10-CM   1. Essential hypertension  I10 CBC    Comprehensive metabolic panel    amLODipine (NORVASC) 10 MG tablet  2. Screening for diabetes mellitus  Z13.1 Hemoglobin A1c  3. Screening for deficiency anemia  Z13.0 CBC  4. Screening  for hyperlipidemia  Z13.220 Lipid panel  5. Screening for colon cancer  Z12.11 Ambulatory referral to Gastroenterology    Following up on essential hypertension today.  Blood pressure is too high on 2.5 mg amlodipine.  We will have her increase to 5 mg, and update me regarding her blood pressure.  If need be will increase her to 10 mg.  She will let me know if any side effects such as lower extremity edema Other routine labs drawn today, follow-up with results Follow-up: No follow-ups on file.  Meds ordered this encounter  Medications  . amLODipine (NORVASC) 10 MG tablet    Sig: Take 0.5 tablets (5 mg total) by mouth daily. Increase to 10 mg if directed by MD    Dispense:  30 tablet    Refill:  6    Please DC order for 2.5 mg   Orders Placed This Encounter  Procedures  . CBC  . Comprehensive metabolic panel  . Hemoglobin A1c  . Lipid panel  . Ambulatory referral to Gastroenterology     Outpatient Encounter Medications as of 10/29/2018  Medication Sig  . amLODipine (NORVASC) 10 MG tablet Take 0.5 tablets (5 mg total) by mouth daily. Increase to 10 mg if directed by MD  . [DISCONTINUED] amLODipine (NORVASC) 2.5 MG tablet Take 1 tablet (2.5 mg total) by mouth daily.  . hydrochlorothiazide (HYDRODIURIL) 12.5 MG tablet TAKE 1 TABLET BY MOUTH EVERY DAY (Patient not taking: Reported on 10/29/2018)   No facility-administered encounter medications on file as of 10/29/2018.     Signed Abbe AmsterdamJessica Copland, MD  6/23 Received her labs- message to pt  Results for orders placed or performed in visit on 10/29/18  CBC  Result Value Ref Range   WBC 4.3 4.0 - 10.5 K/uL   RBC 4.39 3.87 - 5.11 Mil/uL   Platelets 262.0 150.0 - 400.0 K/uL   Hemoglobin 14.2 12.0 - 15.0 g/dL   HCT 45.441.5 09.836.0 - 11.946.0 %   MCV 94.5 78.0 - 100.0 fl   MCHC 34.2 30.0 - 36.0 g/dL   RDW 14.711.8 82.911.5 - 56.215.5 %  Comprehensive metabolic panel  Result Value Ref Range   Sodium 137 135 - 145 mEq/L   Potassium 4.2 3.5 - 5.1 mEq/L    Chloride 102 96 - 112 mEq/L   CO2 26 19 - 32 mEq/L   Glucose, Bld 91 70 - 99 mg/dL   BUN 7 6 - 23 mg/dL   Creatinine, Ser 1.300.72 0.40 - 1.20 mg/dL   Total Bilirubin 0.5 0.2 - 1.2 mg/dL   Alkaline Phosphatase 72 39 - 117 U/L   AST 19 0 - 37 U/L   ALT 17 0 -  35 U/L   Total Protein 6.8 6.0 - 8.3 g/dL   Albumin 4.4 3.5 - 5.2 g/dL   Calcium 9.6 8.4 - 10.5 mg/dL   GFR 97.43 >60.00 mL/min  Hemoglobin A1c  Result Value Ref Range   Hgb A1c MFr Bld 5.4 4.6 - 6.5 %  Lipid panel  Result Value Ref Range   Cholesterol 184 0 - 200 mg/dL   Triglycerides 79.0 0.0 - 149.0 mg/dL   HDL 54.80 >39.00 mg/dL   VLDL 15.8 0.0 - 40.0 mg/dL   LDL Cholesterol 114 (H) 0 - 99 mg/dL   Total CHOL/HDL Ratio 3    NonHDL 129.57

## 2018-10-29 NOTE — Patient Instructions (Signed)
It was good to see you today!   Let's increase your amlodipine to 5 mg; we may end up going up to 10 mg depending on your response.   Please go up to 5 and check your BP for about a week.  If still higher than 140/90 we will increase.  Please keep me posted I will be in touch with your labs and placed a referral to [facility] for you

## 2018-10-30 ENCOUNTER — Encounter: Payer: Self-pay | Admitting: Family Medicine

## 2018-10-30 DIAGNOSIS — E785 Hyperlipidemia, unspecified: Secondary | ICD-10-CM

## 2018-10-30 LAB — COMPREHENSIVE METABOLIC PANEL
ALT: 17 U/L (ref 0–35)
AST: 19 U/L (ref 0–37)
Albumin: 4.4 g/dL (ref 3.5–5.2)
Alkaline Phosphatase: 72 U/L (ref 39–117)
BUN: 7 mg/dL (ref 6–23)
CO2: 26 mEq/L (ref 19–32)
Calcium: 9.6 mg/dL (ref 8.4–10.5)
Chloride: 102 mEq/L (ref 96–112)
Creatinine, Ser: 0.72 mg/dL (ref 0.40–1.20)
GFR: 97.43 mL/min (ref >60.00)
Glucose, Bld: 91 mg/dL (ref 70–99)
Potassium: 4.2 mEq/L (ref 3.5–5.1)
Sodium: 137 mEq/L (ref 135–145)
Total Bilirubin: 0.5 mg/dL (ref 0.2–1.2)
Total Protein: 6.8 g/dL (ref 6.0–8.3)

## 2018-10-30 LAB — CBC
HCT: 41.5 % (ref 36.0–46.0)
Hemoglobin: 14.2 g/dL (ref 12.0–15.0)
MCHC: 34.2 g/dL (ref 30.0–36.0)
MCV: 94.5 fl (ref 78.0–100.0)
Platelets: 262 10*3/uL (ref 150.0–400.0)
RBC: 4.39 Mil/uL (ref 3.87–5.11)
RDW: 11.8 % (ref 11.5–15.5)
WBC: 4.3 10*3/uL (ref 4.0–10.5)

## 2018-10-30 LAB — LIPID PANEL
Cholesterol: 184 mg/dL (ref 0–200)
HDL: 54.8 mg/dL (ref >39.00)
LDL Cholesterol: 114 mg/dL — ABNORMAL HIGH (ref 0–99)
NonHDL: 129.57
Total CHOL/HDL Ratio: 3
Triglycerides: 79 mg/dL (ref 0.0–149.0)
VLDL: 15.8 mg/dL (ref 0.0–40.0)

## 2018-10-30 LAB — HEMOGLOBIN A1C: Hgb A1c MFr Bld: 5.4 % (ref 4.6–6.5)

## 2018-10-30 MED ORDER — LOVASTATIN 20 MG PO TABS: 20.0000 mg | ORAL_TABLET | Freq: Every day | ORAL | 3 refills | Status: DC

## 2018-11-16 ENCOUNTER — Encounter: Payer: Self-pay | Admitting: Family Medicine

## 2019-01-16 ENCOUNTER — Telehealth: Payer: Self-pay

## 2019-01-16 ENCOUNTER — Encounter: Payer: Self-pay | Admitting: Family Medicine

## 2019-01-16 NOTE — Telephone Encounter (Signed)
Did you call patient? No documentation did not call her.

## 2019-01-16 NOTE — Telephone Encounter (Signed)
Copied from [place] [id number]. Topic: General - Other >> Jan 15, 2019 10:27 AM Nils Flack wrote: Reason for XMI:[phone] Pt is returning missed call

## 2019-01-28 ENCOUNTER — Encounter: Payer: Self-pay | Admitting: Family Medicine

## 2019-09-16 ENCOUNTER — Ambulatory Visit: Payer: Medicare Other | Admitting: *Deleted

## 2019-09-19 DIAGNOSIS — Z20828 Contact with and (suspected) exposure to other viral communicable diseases: Secondary | ICD-10-CM | POA: Diagnosis not present

## 2019-09-19 DIAGNOSIS — Z03818 Encounter for observation for suspected exposure to other biological agents ruled out: Secondary | ICD-10-CM | POA: Diagnosis not present

## 2019-09-25 NOTE — Progress Notes (Signed)
Subjective:   Victoria Hogan is a 69 y.o. female who presents for Medicare Annual (Subsequent) preventive examination.  Enjoys making bows.  Review of Systems:  Home Safety/Smoke Alarms: Feels safe in home. Smoke alarms in place.  Lives in 1 story home. Son lives close and checks on her daily.   Female:   Mammo- 09/20/17. ordered      Dexa scan- ordered [facility]- last 2017 per pt.     Objective:     Vitals: BP 140/86 (BP Location: Left Arm, Patient Position: Sitting, Cuff Size: Normal)   Pulse 68   Temp (!) 97 F (36.1 C) (Temporal)   Ht 5' (1.524 m)   Wt 159 lb 9.6 oz (72.4 kg)   SpO2 98%   BMI 31.17 kg/m   Body mass index is 31.17 kg/m.  Advanced Directives 09/26/2019 09/14/2018 09/12/2017 03/06/2012  Does Patient Have a Medical Advance Directive? No No No Patient does not have advance directive  Would patient like information on creating a medical advance directive? No - Patient declined No - Patient declined Yes (MAU/Ambulatory/Procedural Areas - Information given) -  Pre-existing out of facility DNR order (yellow form or pink MOST form) - - - No    Tobacco Social History   Tobacco Use  Smoking Status Never Smoker  Smokeless Tobacco Never Used     Counseling given: Not Answered   Clinical Intake:     Pain : No/denies pain                 Past Medical History:  Diagnosis Date  . Anxiety   . GERD (gastroesophageal reflux disease)   . Hemorrhoids   . History of colonic diverticulitis   . Hypertension   . Pneumonia 02/2012   Past Surgical History:  Procedure Laterality Date  . ABDOMINAL HYSTERECTOMY    . bunyonectomy     Family History  Problem Relation Age of Onset  . Heart failure Father   . Arthritis Mother    Social History   Socioeconomic History  . Marital status: Divorced    Spouse name: Not on file  . Number of children: Not on file  . Years of education: college  . Highest education level: Not on file  Occupational History  .  Occupation: Magazine features editor: [employer]  Tobacco Use  . Smoking status: Never Smoker  . Smokeless tobacco: Never Used  Substance and Sexual Activity  . Alcohol use: Yes    Comment: occasionally  . Drug use: Never  . Sexual activity: Not Currently  Other Topics Concern  . Not on file  Social History Narrative   Bikes daily for exercise.   Social Determinants of Health   Financial Resource Strain:   . Difficulty of Paying Living Expenses:   Food Insecurity:   . Worried About Programme researcher, broadcasting/film/video in the Last Year:   . Barista in the Last Year:   Transportation Needs:   . Freight forwarder (Medical):   Marland Kitchen Lack of Transportation (Non-Medical):   Physical Activity:   . Days of Exercise per Week:   . Minutes of Exercise per Session:   Stress:   . Feeling of Stress :   Social Connections:   . Frequency of Communication with Friends and Family:   . Frequency of Social Gatherings with Friends and Family:   . Attends Religious Services:   . Active Member of Clubs or Organizations:   . Attends Club  or Organization Meetings:   Marland Kitchen Marital Status:     Outpatient Encounter Medications as of 09/26/2019  Medication Sig  . amLODipine (NORVASC) 10 MG tablet Take 0.5 tablets (5 mg total) by mouth daily. Increase to 10 mg if directed by MD  . hydrochlorothiazide (HYDRODIURIL) 12.5 MG tablet TAKE 1 TABLET BY MOUTH EVERY DAY  . lovastatin (MEVACOR) 20 MG tablet Take 1 tablet (20 mg total) by mouth at bedtime.   No facility-administered encounter medications on file as of 09/26/2019.    Activities of Daily Living In your present state of health, do you have any difficulty performing the following activities: 09/26/2019  Hearing? N  Vision? N  Difficulty concentrating or making decisions? N  Walking or climbing stairs? N  Dressing or bathing? N  Doing errands, shopping? N  Preparing Food and eating ? N  Using the Toilet? N  In the past six months, have you  accidently leaked urine? N  Do you have problems with loss of bowel control? N  Managing your Medications? N  Managing your Finances? N  Housekeeping or managing your Housekeeping? N  Some recent data might be hidden    Patient Care Team: Copland, Gay Filler, MD as PCP - General (Family Medicine) Carol Ada, MD as Attending Physician (Gastroenterology)    Assessment:   This is a routine wellness examination for Victoria Hogan. Physical assessment deferred to PCP.  Exercise Activities and Dietary recommendations Current Exercise Habits: The patient does not participate in regular exercise at present, Exercise limited by: None identified Diet (meal preparation, eat out, water intake, caffeinated beverages, dairy products, fruits and vegetables): in general, a "healthy" diet  , well balanced   Goals    . Continue to keep a positive perspective    . Increase physical activity       Fall Risk Fall Risk  09/26/2019 09/14/2018 09/12/2017 10/22/2015 10/20/2015  Falls in the past year? 0 0 No No No  Number falls in past yr: 0 - - - -  Comment - - - - -  Injury with Fall? 0 - - - -  Follow up Education provided;Falls prevention discussed - - - -    Depression Screen PHQ 2/9 Scores 09/26/2019 09/14/2018 09/12/2017 10/22/2015  PHQ - 2 Score 0 0 0 0  PHQ- 9 Score - - - -     Cognitive Function Ad8 score reviewed for issues:  Issues making decisions:no  Less interest in hobbies / activities:no  Repeats questions, stories (family complaining):no  Trouble using ordinary gadgets (microwave, computer, phone):no  Forgets the month or year: no  Mismanaging finances: no  Remembering appts:no  Daily problems with thinking and/or memory:no Ad8 score is=0         Immunization History  Administered Date(s) Administered  . Fluad Quad(high Dose 65+) 01/12/2019  . Influenza Split 03/07/2012  . Influenza, High Dose Seasonal PF 02/01/2018  . Influenza,inj,Quad PF,6+ Mos 04/10/2013, 02/21/2014,  02/09/2015  . Pneumococcal Conjugate-13 08/22/2016  . Pneumococcal Polysaccharide-23 03/07/2012  . Tdap 12/05/2012  . Zoster Recombinat (Shingrix) 01/12/2019, 04/13/2019   Screening Tests Health Maintenance  Topic Date Due  . COVID-19 Vaccine (1) Never done  . MAMMOGRAM  09/21/2019  . INFLUENZA VACCINE  12/08/2019  . COLONOSCOPY  10/13/2020  . TETANUS/TDAP  12/06/2022  . DEXA SCAN  Completed  . Hepatitis C Screening  Completed  . PNA vac Low Risk Adult  Completed     Plan:   See you next year!  Continue to  eat heart healthy diet (full of fruits, vegetables, whole grains, lean protein, water--limit salt, fat, and sugar intake) and increase physical activity as tolerated.  Continue doing brain stimulating activities (puzzles, reading, adult coloring books, staying active) to keep memory sharp.   Bring a copy of your living will and/or healthcare power of attorney to your next office visit.   I have personally reviewed and noted the following in the patient's chart:   . Medical and social history . Use of alcohol, tobacco or illicit drugs  . Current medications and supplements . Functional ability and status . Nutritional status . Physical activity . Advanced directives . List of other physicians . Hospitalizations, surgeries, and ER visits in previous 12 months . Vitals . Screenings to include cognitive, depression, and falls . Referrals and appointments  In addition, I have reviewed and discussed with patient certain preventive protocols, quality metrics, and best practice recommendations. A written personalized care plan for preventive services as well as general preventive health recommendations were provided to patient.     [place]on Gully, [place]  09/26/2019

## 2019-09-26 ENCOUNTER — Other Ambulatory Visit: Payer: Self-pay

## 2019-09-26 ENCOUNTER — Encounter: Payer: Self-pay | Admitting: *Deleted

## 2019-09-26 ENCOUNTER — Ambulatory Visit (INDEPENDENT_AMBULATORY_CARE_PROVIDER_SITE_OTHER): Payer: Medicare PPO | Admitting: *Deleted

## 2019-09-26 VITALS — BP 140/86 | HR 68 | Temp 97.0°F | Ht 60.0 in | Wt 159.6 lb

## 2019-09-26 DIAGNOSIS — Z0000 Encounter for general adult medical examination without abnormal findings: Secondary | ICD-10-CM | POA: Diagnosis not present

## 2019-09-26 DIAGNOSIS — Z1231 Encounter for screening mammogram for malignant neoplasm of breast: Secondary | ICD-10-CM

## 2019-09-26 DIAGNOSIS — Z780 Asymptomatic menopausal state: Secondary | ICD-10-CM

## 2019-09-26 NOTE — Patient Instructions (Signed)
See you next year!  Continue to eat heart healthy diet (full of fruits, vegetables, whole grains, lean protein, water--limit salt, fat, and sugar intake) and increase physical activity as tolerated.  Continue doing brain stimulating activities (puzzles, reading, adult coloring books, staying active) to keep memory sharp.   Bring a copy of your living will and/or healthcare power of attorney to your next office visit.   Victoria Hogan , Thank you for taking time to come for your Medicare Wellness Visit. I appreciate your ongoing commitment to your health goals. Please review the following plan we discussed and let me know if I can assist you in the future.   These are the goals we discussed: Goals    . Continue to keep a positive perspective    . Increase physical activity       This is a list of the screening recommended for you and due dates:  Health Maintenance  Topic Date Due  . COVID-19 Vaccine (1) Never done  . Mammogram  09/21/2019  . Flu Shot  12/08/2019  . Colon Cancer Screening  10/13/2020  . Tetanus Vaccine  12/06/2022  . DEXA scan (bone density measurement)  Completed  .  Hepatitis C: One time screening is recommended by Center for Disease Control  (CDC) for  adults born from [birth year] through 1965.   Completed  . Pneumonia vaccines  Completed    Preventive Care 26 Years and Older, Female Preventive care refers to lifestyle choices and visits with your health care provider that can promote health and wellness. This includes:  A yearly physical exam. This is also called an annual well check.  Regular dental and eye exams.  Immunizations.  Screening for certain conditions.  Healthy lifestyle choices, such as diet and exercise. What can I expect for my preventive care visit? Physical exam Your health care provider will check:  Height and weight. These may be used to calculate body mass index (BMI), which is a measurement that tells if you are at a healthy  weight.  Heart rate and blood pressure.  Your skin for abnormal spots. Counseling Your health care provider may ask you questions about:  Alcohol, tobacco, and drug use.  Emotional well-being.  Home and relationship well-being.  Sexual activity.  Eating habits.  History of falls.  Memory and ability to understand (cognition).  Work and work Statistician.  Pregnancy and menstrual history. What immunizations do I need?  Influenza (flu) vaccine  This is recommended every year. Tetanus, diphtheria, and pertussis (Tdap) vaccine  You may need a Td booster every 10 years. Varicella (chickenpox) vaccine  You may need this vaccine if you have not already been vaccinated. Zoster (shingles) vaccine  You may need this after age 62. Pneumococcal conjugate (PCV13) vaccine  One dose is recommended after age 69. Pneumococcal polysaccharide (PPSV23) vaccine  One dose is recommended after age 80. Measles, mumps, and rubella (MMR) vaccine  You may need at least one dose of MMR if you were born in 1957 or later. You may also need a second dose. Meningococcal conjugate (MenACWY) vaccine  You may need this if you have certain conditions. Hepatitis A vaccine  You may need this if you have certain conditions or if you travel or work in places where you may be exposed to hepatitis A. Hepatitis B vaccine  You may need this if you have certain conditions or if you travel or work in places where you may be exposed to hepatitis B. Haemophilus influenzae type  b (Hib) vaccine  You may need this if you have certain conditions. You may receive vaccines as individual doses or as more than one vaccine together in one shot (combination vaccines). Talk with your health care provider about the risks and benefits of combination vaccines. What tests do I need? Blood tests  Lipid and cholesterol levels. These may be checked every 5 years, or more frequently depending on your overall  health.  Hepatitis C test.  Hepatitis B test. Screening  Lung cancer screening. You may have this screening every year starting at age 76 if you have a 30-pack-year history of smoking and currently smoke or have quit within the past 15 years.  Colorectal cancer screening. All adults should have this screening starting at age 28 and continuing until age 51. Your health care provider may recommend screening at age 110 if you are at increased risk. You will have tests every 1-10 years, depending on your results and the type of screening test.  Diabetes screening. This is done by checking your blood sugar (glucose) after you have not eaten for a while (fasting). You may have this done every 1-3 years.  Mammogram. This may be done every 1-2 years. Talk with your health care provider about how often you should have regular mammograms.  BRCA-related cancer screening. This may be done if you have a family history of breast, ovarian, tubal, or peritoneal cancers. Other tests  Sexually transmitted disease (STD) testing.  Bone density scan. This is done to screen for osteoporosis. You may have this done starting at age 35. Follow these instructions at home: Eating and drinking  Eat a diet that includes fresh fruits and vegetables, whole grains, lean protein, and low-fat dairy products. Limit your intake of foods with high amounts of sugar, saturated fats, and salt.  Take vitamin and mineral supplements as recommended by your health care provider.  Do not drink alcohol if your health care provider tells you not to drink.  If you drink alcohol: ? Limit how much you have to 0-1 drink a day. ? Be aware of how much alcohol is in your drink. In the [place] one drink equals one 12 oz bottle of beer (355 mL), one 5 oz glass of wine (148 mL), or one 1 oz glass of hard liquor (44 mL). Lifestyle  Take daily care of your teeth and gums.  Stay active. Exercise for at least 30 minutes on 5 or more days  each week.  Do not use any products that contain nicotine or tobacco, such as cigarettes, e-cigarettes, and chewing tobacco. If you need help quitting, ask your health care provider.  If you are sexually active, practice safe sex. Use a condom or other form of protection in order to prevent STIs (sexually transmitted infections).  Talk with your health care provider about taking a low-dose aspirin or statin. What's next?  Go to your health care provider once a year for a well check visit.  Ask your health care provider how often you should have your eyes and teeth checked.  Stay up to date on all vaccines. This information is not intended to replace advice given to you by your health care provider. Make sure you discuss any questions you have with your health care provider. Document Revised: 04/19/2018 Document Reviewed: 04/19/2018 Elsevier Patient Education  2020 [employer].

## 2019-10-02 NOTE — Patient Instructions (Addendum)
It was great to see you again today, I will be in touch with your labs as soon as possible Best of luck with your mom!  For BP, we will stop amlodipine and put you on lisinopril/hctz 10/12.5 mg once a day.  If your BP is high on this we can double the dose- keep me posted Please see me in 6 months    Health Maintenance After Age 69 After age 85, you are at a higher risk for certain long-term diseases and infections as well as injuries from falls. Falls are a major cause of broken bones and head injuries in people who are older than age 16. Getting regular preventive care can help to keep you healthy and well. Preventive care includes getting regular testing and making lifestyle changes as recommended by your health care provider. Talk with your health care provider about:  Which screenings and tests you should have. A screening is a test that checks for a disease when you have no symptoms.  A diet and exercise plan that is right for you. What should I know about screenings and tests to prevent falls? Screening and testing are the best ways to find a health problem early. Early diagnosis and treatment give you the best chance of managing medical conditions that are common after age 28. Certain conditions and lifestyle choices may make you more likely to have a fall. Your health care provider may recommend:  Regular vision checks. Poor vision and conditions such as cataracts can make you more likely to have a fall. If you wear glasses, make sure to get your prescription updated if your vision changes.  Medicine review. Work with your health care provider to regularly review all of the medicines you are taking, including over-the-counter medicines. Ask your health care provider about any side effects that may make you more likely to have a fall. Tell your health care provider if any medicines that you take make you feel dizzy or sleepy.  Osteoporosis screening. Osteoporosis is a condition that  causes the bones to get weaker. This can make the bones weak and cause them to break more easily.  Blood pressure screening. Blood pressure changes and medicines to control blood pressure can make you feel dizzy.  Strength and balance checks. Your health care provider may recommend certain tests to check your strength and balance while standing, walking, or changing positions.  Foot health exam. Foot pain and numbness, as well as not wearing proper footwear, can make you more likely to have a fall.  Depression screening. You may be more likely to have a fall if you have a fear of falling, feel emotionally low, or feel unable to do activities that you used to do.  Alcohol use screening. Using too much alcohol can affect your balance and may make you more likely to have a fall. What actions can I take to lower my risk of falls? General instructions  Talk with your health care provider about your risks for falling. Tell your health care provider if: ? You fall. Be sure to tell your health care provider about all falls, even ones that seem minor. ? You feel dizzy, sleepy, or off-balance.  Take over-the-counter and prescription medicines only as told by your health care provider. These include any supplements.  Eat a healthy diet and maintain a healthy weight. A healthy diet includes low-fat dairy products, low-fat (lean) meats, and fiber from whole grains, beans, and lots of fruits and vegetables. Home safety  Remove  any tripping hazards, such as rugs, cords, and clutter.  Install safety equipment such as grab bars in bathrooms and safety rails on stairs.  Keep rooms and walkways well-lit. Activity   Follow a regular exercise program to stay fit. This will help you maintain your balance. Ask your health care provider what types of exercise are appropriate for you.  If you need a cane or walker, use it as recommended by your health care provider.  Wear supportive shoes that have nonskid  soles. Lifestyle  Do not drink alcohol if your health care provider tells you not to drink.  If you drink alcohol, limit how much you have: ? 0-1 drink a day for women. ? 0-2 drinks a day for men.  Be aware of how much alcohol is in your drink. In the [place] one drink equals one typical bottle of beer (12 oz), one-half glass of wine (5 oz), or one shot of hard liquor (1 oz).  Do not use any products that contain nicotine or tobacco, such as cigarettes and e-cigarettes. If you need help quitting, ask your health care provider. Summary  Having a healthy lifestyle and getting preventive care can help to protect your health and wellness after age 54.  Screening and testing are the best way to find a health problem early and help you avoid having a fall. Early diagnosis and treatment give you the best chance for managing medical conditions that are more common for people who are older than age 74.  Falls are a major cause of broken bones and head injuries in people who are older than age 38. Take precautions to prevent a fall at home.  Work with your health care provider to learn what changes you can make to improve your health and wellness and to prevent falls. This information is not intended to replace advice given to you by your health care provider. Make sure you discuss any questions you have with your health care provider. Document Revised: 08/16/2018 Document Reviewed: 03/08/2017 Elsevier Patient Education  2020 [employer].

## 2019-10-02 NOTE — Progress Notes (Addendum)
[facility] [address] [place], [place] [address] [phone]  Date:  10/03/2019   Name:  Victoria Hogan   DOB:  [birth year]   MRN:  [id number]  PCP:  Pearline Cables, MD    Chief Complaint: Annual Exam and Hypertension (med check)   History of Present Illness:  Victoria Hogan is a 69 y.o. very pleasant female patient who presents with the following:  Patient with history of hypertension, osteopenia, vitamin D deficiency, diverticulitis, dyslipidemia.  Here today for routine physical Last seen by myself about 1 year ago for a physical.  At that time we increased her amlodipine slightly due to elevated blood pressure Pt notes that she put on some weight over the last month and her BP has come up some  BP Readings from Last 3 Encounters:  10/03/19 (!) 142/78  09/26/19 140/86  10/29/18 (!) 170/90   She increased her amlodipine to 10 mg a few days ago- however this does cause LE edema She notes a direct correlation between increasing her dose to 10 mg and ankle swelling.  Her ankle swelling tends to get worse towards the evening.  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  This is a challenge for them, her mother is fairly demanding and not open of help outside of the family.  They did finally managed to hire someone to help with some of the responsibilities Patient Active Problem List    Diagnosis Date Noted  . Osteopenia 08/30/2016  . Overweight 08/22/2016  . Hypertension 04/08/2012  . Diverticulitis large intestine 03/23/2012  . Vitamin D deficiency 03/14/2012  . Heart murmur 03/06/2012    Past Medical History:  Diagnosis Date  . Anxiety   . GERD (gastroesophageal reflux disease)   . Hemorrhoids   . History of colonic diverticulitis   . Hypertension   . Pneumonia 02/2012    Past Surgical History:  Procedure Laterality Date  . ABDOMINAL HYSTERECTOMY    . bunyonectomy      Social History   Tobacco Use  . Smoking status: Never Smoker  . Smokeless tobacco: Never Used  Substance Use Topics  . Alcohol use: Yes    Comment: occasionally  . Drug use: Never    Family History  Problem Relation Age of Onset  . Heart failure Father   . Arthritis Mother     No Known Allergies  Medication list has been reviewed and updated.  Current Outpatient Medications on File Prior to Visit  Medication Sig Dispense Refill  . amLODipine (NORVASC) 10 MG tablet Take 0.5 tablets (5 mg total) by mouth daily. Increase to 10 mg if directed by MD (Patient taking differently: Take 10 mg by mouth daily. Increase to 10 mg if directed by MD) 30 tablet 6  . hydrochlorothiazide (HYDRODIURIL) 12.5 MG tablet TAKE 1 TABLET BY MOUTH EVERY DAY 90 tablet 1  . lovastatin (MEVACOR) 20  MG tablet Take 1 tablet (20 mg total) by mouth at bedtime. 90 tablet 3   No current facility-administered medications on file prior to visit.    Review of Systems:  As per HPI- otherwise negative.   Physical Examination: Vitals:   10/03/19 0919  BP: (!) 142/78  Pulse: 86  Resp: 15  Temp: (!) 97.5 F (36.4 C)  SpO2: 98%   Vitals:   10/03/19 0919  Weight: 159 lb (72.1 kg)  Height: 5' (1.524 m)   Body mass index is 31.05 kg/m. Ideal Body Weight: Weight in (lb) to have BMI = 25: 127.7  GEN: no acute distress.  Obese, looks well HEENT: Atraumatic, Normocephalic.  Ears and Nose: No  external deformity. CV: RRR, No M/G/R. No JVD. No thrill. No extra heart sounds. PULM: CTA B, no wheezes, crackles, rhonchi. No retractions. No resp. distress. No accessory muscle use. ABD: S, NT, ND, +BS. No rebound. No HSM. EXTR: No c/c. Mild B lower extremity edema due to amlodipine use  PSYCH: Normally interactive. Conversant.    Assessment and Plan: Physical exam  Dyslipidemia - Plan: Lipid panel, lovastatin (MEVACOR) 20 MG tablet  Screening for diabetes mellitus - Plan: Comprehensive metabolic panel, Hemoglobin A1c  Screening for deficiency anemia - Plan: CBC  Essential hypertension - Plan: CBC, Comprehensive metabolic panel, lisinopril-hydrochlorothiazide (ZESTORETIC) 10-12.5 MG tablet  Vitamin D deficiency - Plan: VITAMIN D 25 Hydroxy (Vit-D Deficiency, Fractures)  Osteopenia, unspecified location - Plan: VITAMIN D 25 Hydroxy (Vit-D Deficiency, Fractures)  Estrogen deficiency - Plan: VITAMIN D 25 Hydroxy (Vit-D Deficiency, Fractures)  Screening for thyroid disorder - Plan: TSH   Here today for complete physical.  Labs pending as above Mammogram and bone density are ordered She would like to come off amlodipine as a cause of swelling.  Will DC amlodipine, start her on lisinopril/HCTZ 10/12.5, she will monitor her blood pressure, if need be we can increase dose Will plan further follow- up pending labs. Plan to visit in 6 months  Discussed diet, exercise, tobacco and alcohol avoidance This visit occurred during the SARS-CoV-2 public health emergency.  Safety protocols were in place, including screening questions prior to the visit, additional usage of staff PPE, and extensive cleaning of exam room while observing appropriate contact time as indicated for disinfecting solutions.    Signed Lamar Blinks, MD  Received her labs as below, message to patient  Results for orders placed or performed in visit on 10/03/19  CBC  Result Value Ref Range   WBC 4.3 4.0 - 10.5  K/uL   RBC 4.36 3.87 - 5.11 Mil/uL   Platelets 305.0 150.0 - 400.0 K/uL   Hemoglobin 13.7 12.0 - 15.0 g/dL   HCT 40.8 36.0 - 46.0 %   MCV 93.6 78.0 - 100.0 fl   MCHC 33.5 30.0 - 36.0 g/dL   RDW 12.3 11.5 - 15.5 %  Comprehensive metabolic panel  Result Value Ref Range   Sodium 137 135 - 145 mEq/L   Potassium 4.2 3.5 - 5.1 mEq/L   Chloride 101 96 - 112 mEq/L   CO2 30 19 - 32 mEq/L   Glucose, Bld 107 (H) 70 - 99 mg/dL   BUN 11 6 - 23 mg/dL   Creatinine, Ser 0.77 0.40 - 1.20 mg/dL   Total Bilirubin 0.4 0.2 - 1.2 mg/dL   Alkaline Phosphatase 93 39 - 117 U/L   AST 19 0 - 37 U/L   ALT 17 0 - 35 U/L   Total Protein 7.2  6.0 - 8.3 g/dL   Albumin 4.4 3.5 - 5.2 g/dL   GFR 01.77 >93.90 mL/min   Calcium 9.5 8.4 - 10.5 mg/dL  Hemoglobin Z0S  Result Value Ref Range   Hgb A1c MFr Bld 5.6 4.6 - 6.5 %  Lipid panel  Result Value Ref Range   Cholesterol 186 0 - 200 mg/dL   Triglycerides 92.3 0.0 - 149.0 mg/dL   HDL 30.07 >62.26 mg/dL   VLDL 33.3 0.0 - 54.5 mg/dL   LDL Cholesterol 625 (H) 0 - 99 mg/dL   Total CHOL/HDL Ratio 3    NonHDL 116.03   TSH  Result Value Ref Range   TSH 1.87 0.35 - 4.50 uIU/mL  VITAMIN D 25 Hydroxy (Vit-D Deficiency, Fractures)  Result Value Ref Range   VITD 17.56 (L) 30.00 - 100.00 ng/mL

## 2019-10-03 ENCOUNTER — Encounter: Payer: Self-pay | Admitting: Family Medicine

## 2019-10-03 ENCOUNTER — Other Ambulatory Visit: Payer: Self-pay

## 2019-10-03 ENCOUNTER — Ambulatory Visit (HOSPITAL_BASED_OUTPATIENT_CLINIC_OR_DEPARTMENT_OTHER): Payer: Medicare PPO

## 2019-10-03 ENCOUNTER — Ambulatory Visit (INDEPENDENT_AMBULATORY_CARE_PROVIDER_SITE_OTHER): Payer: Medicare PPO | Admitting: Family Medicine

## 2019-10-03 VITALS — BP 142/78 | HR 86 | Temp 97.5°F | Resp 15 | Ht 60.0 in | Wt 159.0 lb

## 2019-10-03 DIAGNOSIS — I10 Essential (primary) hypertension: Secondary | ICD-10-CM

## 2019-10-03 DIAGNOSIS — Z131 Encounter for screening for diabetes mellitus: Secondary | ICD-10-CM

## 2019-10-03 DIAGNOSIS — E2839 Other primary ovarian failure: Secondary | ICD-10-CM

## 2019-10-03 DIAGNOSIS — Z1329 Encounter for screening for other suspected endocrine disorder: Secondary | ICD-10-CM

## 2019-10-03 DIAGNOSIS — E559 Vitamin D deficiency, unspecified: Secondary | ICD-10-CM | POA: Diagnosis not present

## 2019-10-03 DIAGNOSIS — Z0000 Encounter for general adult medical examination without abnormal findings: Secondary | ICD-10-CM | POA: Diagnosis not present

## 2019-10-03 DIAGNOSIS — M8580 Other specified disorders of bone density and structure, unspecified site: Secondary | ICD-10-CM

## 2019-10-03 DIAGNOSIS — Z130 Encounter for screening for diseases of the blood and blood-forming organs and certain disorders involving the immune mechanism: Secondary | ICD-10-CM

## 2019-10-03 DIAGNOSIS — E785 Hyperlipidemia, unspecified: Secondary | ICD-10-CM

## 2019-10-03 LAB — COMPREHENSIVE METABOLIC PANEL
ALT: 17 U/L (ref 0–35)
AST: 19 U/L (ref 0–37)
Albumin: 4.4 g/dL (ref 3.5–5.2)
Alkaline Phosphatase: 93 U/L (ref 39–117)
BUN: 11 mg/dL (ref 6–23)
CO2: 30 mEq/L (ref 19–32)
Calcium: 9.5 mg/dL (ref 8.4–10.5)
Chloride: 101 mEq/L (ref 96–112)
Creatinine, Ser: 0.77 mg/dL (ref 0.40–1.20)
GFR: 89.92 mL/min (ref >60.00)
Glucose, Bld: 107 mg/dL — ABNORMAL HIGH (ref 70–99)
Potassium: 4.2 mEq/L (ref 3.5–5.1)
Sodium: 137 mEq/L (ref 135–145)
Total Bilirubin: 0.4 mg/dL (ref 0.2–1.2)
Total Protein: 7.2 g/dL (ref 6.0–8.3)

## 2019-10-03 LAB — LIPID PANEL
Cholesterol: 186 mg/dL (ref 0–200)
HDL: 69.6 mg/dL (ref >39.00)
LDL Cholesterol: 104 mg/dL — ABNORMAL HIGH (ref 0–99)
NonHDL: 116.03
Total CHOL/HDL Ratio: 3
Triglycerides: 61 mg/dL (ref 0.0–149.0)
VLDL: 12.2 mg/dL (ref 0.0–40.0)

## 2019-10-03 LAB — CBC
HCT: 40.8 % (ref 36.0–46.0)
Hemoglobin: 13.7 g/dL (ref 12.0–15.0)
MCHC: 33.5 g/dL (ref 30.0–36.0)
MCV: 93.6 fl (ref 78.0–100.0)
Platelets: 305 10*3/uL (ref 150.0–400.0)
RBC: 4.36 Mil/uL (ref 3.87–5.11)
RDW: 12.3 % (ref 11.5–15.5)
WBC: 4.3 10*3/uL (ref 4.0–10.5)

## 2019-10-03 LAB — TSH: TSH: 1.87 u[IU]/mL (ref 0.35–4.50)

## 2019-10-03 LAB — HEMOGLOBIN A1C: Hgb A1c MFr Bld: 5.6 % (ref 4.6–6.5)

## 2019-10-03 LAB — VITAMIN D 25 HYDROXY (VIT D DEFICIENCY, FRACTURES): VITD: 17.56 ng/mL — ABNORMAL LOW (ref 30.00–100.00)

## 2019-10-03 MED ORDER — VITAMIN D3 1.25 MG (50000 UT) PO CAPS: ORAL_CAPSULE | ORAL | 0 refills | Status: DC

## 2019-10-03 MED ORDER — LISINOPRIL-HYDROCHLOROTHIAZIDE 10-12.5 MG PO TABS: 1.0000 | ORAL_TABLET | Freq: Every day | ORAL | 3 refills | Status: DC

## 2019-10-03 MED ORDER — LOVASTATIN 20 MG PO TABS: 20.0000 mg | ORAL_TABLET | Freq: Every day | ORAL | 3 refills | Status: DC

## 2019-10-03 NOTE — Addendum Note (Signed)
Addended by: Abbe Amsterdam C on: 10/03/2019 02:11 PM   Modules accepted: Orders

## 2019-10-04 ENCOUNTER — Other Ambulatory Visit: Payer: Self-pay | Admitting: Family Medicine

## 2019-10-04 DIAGNOSIS — I10 Essential (primary) hypertension: Secondary | ICD-10-CM

## 2019-10-10 ENCOUNTER — Ambulatory Visit (HOSPITAL_BASED_OUTPATIENT_CLINIC_OR_DEPARTMENT_OTHER): Payer: Medicare PPO

## 2019-10-10 ENCOUNTER — Other Ambulatory Visit (HOSPITAL_BASED_OUTPATIENT_CLINIC_OR_DEPARTMENT_OTHER): Payer: Medicare PPO

## 2019-10-17 ENCOUNTER — Ambulatory Visit (HOSPITAL_BASED_OUTPATIENT_CLINIC_OR_DEPARTMENT_OTHER)
Admission: RE | Admit: 2019-10-17 | Discharge: 2019-10-17 | Disposition: A | Discharge status: Home / Self Care | Payer: Medicare PPO | Source: Ambulatory Visit | Attending: Family Medicine | Admitting: Family Medicine

## 2019-10-17 ENCOUNTER — Other Ambulatory Visit: Payer: Self-pay

## 2019-10-17 DIAGNOSIS — Z1231 Encounter for screening mammogram for malignant neoplasm of breast: Secondary | ICD-10-CM | POA: Diagnosis not present

## 2019-10-17 DIAGNOSIS — M810 Age-related osteoporosis without current pathological fracture: Secondary | ICD-10-CM | POA: Diagnosis not present

## 2019-10-17 DIAGNOSIS — Z780 Asymptomatic menopausal state: Secondary | ICD-10-CM | POA: Insufficient documentation

## 2019-10-20 ENCOUNTER — Encounter: Payer: Self-pay | Admitting: Family Medicine

## 2019-10-21 MED ORDER — ALENDRONATE SODIUM 70 MG PO TABS
70.0000 mg | ORAL_TABLET | ORAL | 3 refills | Status: DC
Start: 2019-10-21 — End: 2020-09-17

## 2019-10-27 ENCOUNTER — Encounter: Payer: Self-pay | Admitting: Family Medicine

## 2019-10-28 ENCOUNTER — Ambulatory Visit (HOSPITAL_COMMUNITY): Payer: Self-pay

## 2019-10-31 DIAGNOSIS — H2000 Unspecified acute and subacute iridocyclitis: Secondary | ICD-10-CM | POA: Diagnosis not present

## 2019-10-31 DIAGNOSIS — H20013 Primary iridocyclitis, bilateral: Secondary | ICD-10-CM | POA: Diagnosis not present

## 2019-11-01 DIAGNOSIS — H2013 Chronic iridocyclitis, bilateral: Secondary | ICD-10-CM | POA: Diagnosis not present

## 2019-11-01 DIAGNOSIS — H21541 Posterior synechiae (iris), right eye: Secondary | ICD-10-CM | POA: Diagnosis not present

## 2019-11-06 NOTE — Addendum Note (Signed)
Addended by: Abbe Amsterdam C on: 11/06/2019 05:20 AM   Modules accepted: Orders

## 2019-11-08 DIAGNOSIS — H2013 Chronic iridocyclitis, bilateral: Secondary | ICD-10-CM | POA: Diagnosis not present

## 2019-11-08 DIAGNOSIS — H21541 Posterior synechiae (iris), right eye: Secondary | ICD-10-CM | POA: Diagnosis not present

## 2019-12-09 DIAGNOSIS — H2000 Unspecified acute and subacute iridocyclitis: Secondary | ICD-10-CM | POA: Diagnosis not present

## 2019-12-09 DIAGNOSIS — H20011 Primary iridocyclitis, right eye: Secondary | ICD-10-CM | POA: Diagnosis not present

## 2019-12-20 ENCOUNTER — Other Ambulatory Visit: Payer: Self-pay | Admitting: Family Medicine

## 2019-12-20 DIAGNOSIS — E559 Vitamin D deficiency, unspecified: Secondary | ICD-10-CM

## 2019-12-20 NOTE — Telephone Encounter (Signed)
Can this patient receive a refill on this medicine?   Last seen and last refill was 10-03-2019.  Please advise

## 2020-03-16 DIAGNOSIS — H20021 Recurrent acute iridocyclitis, right eye: Secondary | ICD-10-CM | POA: Diagnosis not present

## 2020-03-17 NOTE — Progress Notes (Addendum)
Victoria Hogan [address], [address] [place], [place] [address] [phone]  Date:  03/18/2020   Name:  Victoria Hogan   DOB:  04/29/1951   MRN:  [id number]  PCP:  Victoria Cables, MD    Chief Complaint: Hypertension (elevated readings at home, pain over right eye, seen at eye doctor normal ) and Fatigue   History of Present Illness:  Victoria Hogan is a 69 y.o. very pleasant female patient who presents with the following:  Patient here today to discuss a couple of concerns-fatigue and elevated blood pressure She saw her optho yesterday and had her pupil dilated (still dilated today)- all seems to be ok She has history of hypertension, diverticulitis, osteoporosis, vitamin D deficiency  She has noted some elevated BP recently at home- up to about 140- 160/90- 100.  Her BP was elevated at home right prior to her visit today -she wonders if her cuff might not be accurate Her home machine is fairly new  She increased her lisinpril/hctz from a 1/2 to a whole tablet a few days ago and her BP looks good in clinic today  She has felt tired recently-however he has been under a lot of stress, further details below  BP Readings from Last 3 Encounters:  03/18/20 122/80  10/03/19 (!) 142/78  09/26/19 140/86   Flu vaccine- give today  COVID-19 series complete, booster not done yet, Recommended ASAP Shingrix complete Most recent labs in May of this year-CMP, lipids, CBC, A1c, thyroid, vitamin D  She cared for her mother during her decline and death over the last few months Her brother also had been ill and passed away around the same time Her mother died at age 25 on 01-Feb-2023, her brother passed on 01/26/2023  Victoria Hogan notes that watching her mother go downhill was really tough for her She has her good and bad days right now She is not sure if she is depressed or still mourning.  She denies any suicidal ideation, does have sometimes when she feels  hopeless and very down.  She is interested in trying medication for depression Patient Active Problem List   Diagnosis Date Noted  . Osteoporosis 08/30/2016  . Overweight 08/22/2016  . Hypertension 04/08/2012  . Diverticulitis large intestine 03/23/2012  . Vitamin D deficiency 03/14/2012  . Heart murmur 03/06/2012    Past Medical History:  Diagnosis Date  . Anxiety   . GERD (gastroesophageal reflux disease)   . Hemorrhoids   . History of colonic diverticulitis   . Hypertension   . Pneumonia 02/2012    Past Surgical History:  Procedure Laterality Date  . ABDOMINAL HYSTERECTOMY    . bunyonectomy      Social History   Tobacco Use  . Smoking status: Never Smoker  . Smokeless tobacco: Never Used  Vaping Use  . Vaping Use: Never used  Substance Use Topics  . Alcohol use: Yes    Comment: occasionally  . Drug use: Never    Family History  Problem Relation Age of Onset  . Heart failure Father   . Arthritis Mother     No Known Allergies  Medication list has been reviewed and updated.  Current Outpatient Medications on File Prior to Visit  Medication Sig Dispense Refill  . alendronate (FOSAMAX) 70 MG tablet Take 1 tablet (70 mg total) by mouth every 7 (seven) days. Take with a full glass of water on an empty stomach.  12 tablet 3  . lisinopril-hydrochlorothiazide (ZESTORETIC) 10-12.5 MG tablet Take 1 tablet by mouth daily. 90 tablet 3  . lovastatin (MEVACOR) 20 MG tablet Take 1 tablet (20 mg total) by mouth at bedtime. 90 tablet 3   No current facility-administered medications on file prior to visit.    Review of Systems:  As per HPI- otherwise negative.   Physical Examination: Vitals:   03/18/20 1118  BP: 122/80  Pulse: (!) 110  Resp: 16  SpO2: 97%   Vitals:   03/18/20 1118  Weight: 155 lb (70.3 kg)  Height: 5' (1.524 m)   Body mass index is 30.27 kg/m. Ideal Body Weight: Weight in (lb) to have BMI = 25: 127.7  GEN: no acute distress.  Overweight,  looks well Right pupil is dilated, patient is currently using atropine drops for pain per her ophthalmologist No tenderness over the temporal artery on the right HEENT: Atraumatic, Normocephalic.  Ears and Nose: No external deformity. CV: RRR, No M/G/R. No JVD. No thrill. No extra heart sounds. PULM: CTA B, no wheezes, crackles, rhonchi. No retractions. No resp. distress. No accessory muscle use. EXTR: No c/c/e PSYCH: Normally interactive. Conversant.    Assessment and Plan: Essential hypertension - Plan: Basic metabolic panel  Immunization due  Acute right eye pain - Plan: Sedimentation rate, C-reactive protein  Adjustment disorder with depressed mood - Plan: FLUoxetine (PROZAC) 20 MG capsule     Patient seen today with a couple of concerns She has noted right eye pain.  Recently seen by her ophthalmologist/optometrist, she has been treated with drops.  Will check CRP and sed rate today to help rule out temporal arteritis  Blood pressure looks okay today.  Patient will check her blood pressure cuff against a friend's cuff and make sure it is accurate.  If it is accurate and she continues to run high at home she will let me know, we will make further adjustment  Flu vaccine given  Start on fluoxetine 20 mg for depression, discussed most recent side effects.  May increase to 2:40 or 3 weeks.  Patient will contact me if not seeing improvement the next few weeks, sooner if worse This visit occurred during the SARS-CoV-2 public health emergency.  Safety protocols were in place, including screening questions prior to the visit, additional usage of staff PPE, and extensive cleaning of exam room while observing appropriate contact time as indicated for disinfecting solutions.    Signed Abbe Amsterdam, MD  Received labs as below, message to pt Results for orders placed or performed in visit on 03/18/20  Sedimentation rate  Result Value Ref Range   Sed Rate 5 0 - 30 mm/hr  C-reactive  protein  Result Value Ref Range   CRP <1.0 0.5 - 20.0 mg/dL  Basic metabolic panel  Result Value Ref Range   Sodium 133 (L) 135 - 145 mEq/L   Potassium 4.3 3.5 - 5.1 mEq/L   Chloride 96 96 - 112 mEq/L   CO2 29 19 - 32 mEq/L   Glucose, Bld 111 (H) 70 - 99 mg/dL   BUN 18 6 - 23 mg/dL   Creatinine, Ser 8.46 0.40 - 1.20 mg/dL   GFR 65.99 >35.70 mL/min   Calcium 9.5 8.4 - 10.5 mg/dL

## 2020-03-17 NOTE — Patient Instructions (Addendum)
It was great to see you again today! I am so sorry for the recent loss of your mom and brother Let's have you start on fluoxetine 20 mg - take once a day,  After 2-3 weeks ok to increase to 40 mg Please let me know how this works for you BP looks good today- please check your BP cuff against another one.  If you are indeed running too high at home let me know Labs today to check kidneys and rule out temporal arteritis  Please see me in 2-3 months if your depression sx are not resolved.  Otherwise recheck in 6 months

## 2020-03-18 ENCOUNTER — Ambulatory Visit (INDEPENDENT_AMBULATORY_CARE_PROVIDER_SITE_OTHER): Payer: Medicare PPO | Admitting: Family Medicine

## 2020-03-18 ENCOUNTER — Encounter: Payer: Self-pay | Admitting: Family Medicine

## 2020-03-18 ENCOUNTER — Other Ambulatory Visit: Payer: Self-pay

## 2020-03-18 VITALS — BP 122/80 | HR 110 | Resp 16 | Ht 60.0 in | Wt 155.0 lb

## 2020-03-18 DIAGNOSIS — Z23 Encounter for immunization: Secondary | ICD-10-CM | POA: Diagnosis not present

## 2020-03-18 DIAGNOSIS — I10 Essential (primary) hypertension: Secondary | ICD-10-CM | POA: Diagnosis not present

## 2020-03-18 DIAGNOSIS — H5711 Ocular pain, right eye: Secondary | ICD-10-CM | POA: Diagnosis not present

## 2020-03-18 DIAGNOSIS — F4321 Adjustment disorder with depressed mood: Secondary | ICD-10-CM

## 2020-03-18 LAB — BASIC METABOLIC PANEL
BUN: 18 mg/dL (ref 6–23)
CO2: 29 mEq/L (ref 19–32)
Calcium: 9.5 mg/dL (ref 8.4–10.5)
Chloride: 96 mEq/L (ref 96–112)
Creatinine, Ser: 0.81 mg/dL (ref 0.40–1.20)
GFR: 74.01 mL/min (ref >60.00)
Glucose, Bld: 111 mg/dL — ABNORMAL HIGH (ref 70–99)
Potassium: 4.3 mEq/L (ref 3.5–5.1)
Sodium: 133 mEq/L — ABNORMAL LOW (ref 135–145)

## 2020-03-18 LAB — SEDIMENTATION RATE: Sed Rate: 5 mm/hr (ref 0–30)

## 2020-03-18 LAB — C-REACTIVE PROTEIN: CRP: <1 mg/dL (ref 0.5–20.0)

## 2020-03-18 MED ORDER — FLUOXETINE HCL 20 MG PO CAPS: 20.0000 mg | ORAL_CAPSULE | Freq: Every day | ORAL | 5 refills | Status: DC

## 2020-03-18 NOTE — Addendum Note (Signed)
Addended by: Steve Rattler A on: 03/18/2020 01:04 PM   Modules accepted: Orders

## 2020-03-24 ENCOUNTER — Encounter: Payer: Self-pay | Admitting: Family Medicine

## 2020-03-24 NOTE — Telephone Encounter (Signed)
Patient referencing fluoxetine 20 mg capsule daily.

## 2020-04-06 ENCOUNTER — Encounter: Payer: Self-pay | Admitting: Family Medicine

## 2020-04-16 ENCOUNTER — Encounter: Payer: Self-pay | Admitting: Family Medicine

## 2020-04-16 ENCOUNTER — Other Ambulatory Visit: Payer: Self-pay

## 2020-04-16 ENCOUNTER — Ambulatory Visit (INDEPENDENT_AMBULATORY_CARE_PROVIDER_SITE_OTHER): Payer: Medicare PPO | Admitting: Family Medicine

## 2020-04-16 VITALS — BP 128/82 | HR 85 | Temp 98.1°F | Resp 17 | Ht 60.0 in | Wt 153.0 lb

## 2020-04-16 DIAGNOSIS — R1032 Left lower quadrant pain: Secondary | ICD-10-CM

## 2020-04-16 MED ORDER — AMOXICILLIN-POT CLAVULANATE 875-125 MG PO TABS: 1.0000 | ORAL_TABLET | Freq: Two times a day (BID) | ORAL | 0 refills | Status: DC

## 2020-04-16 NOTE — Progress Notes (Signed)
[facility] [address], [address] [place], [place] [address] [phone]  Date:  04/16/2020   Name:  Victoria Hogan   DOB:  1950/06/29   MRN:  [id number]  PCP:  Pearline Cables, MD    Chief Complaint: Abdominal Pain (Started Monday, bloating, pain on lower left side, LBM today)   History of Present Illness:  Victoria Hogan is a 69 y.o. very pleasant female patient who presents with the following:  Patient with history of hypertension, diverticulitis-contacted me via MyChart with concern of abdominal pain  Last visit about 1 month ago, at that time she was suffering from depression.  We started her on fluoxetine and she is having good results  This morning she sent me the following my chart message Good morning Dr. Patsy Lager, I want to let you know the progress I have made so far. My mood and energy levels have improved and I am really enjoying that. This week I have had some bloating, gas and stomach  pain. I continue to walk or use the treadmill everyday and I have been drinking ginger and peppermint tea and I have tried adding more fiber to my diet. The stomach pain is not severe at all but altogether uncomfortable. I took 2 Gas-X on Monday but I have not taken anything OTC since then because I wanted to check with you to see if this is okay. Can I continue taking Gas-X or is there anything better?  She has noted a mild pain in her LLQ which first came up 4 days ago Having a BM helps She also felt bloated and had more gas Taking gas-x did seem to help No vomiting No fever She got her covid booster yesterday and did fine  No urinary symptoms  She is still exercising and went to the gym this morning.  She notes that exercise actually made her feel better.  She feels like her symptoms are improving over the last couple of days  Wt Readings from Last 3 Encounters:  04/16/20 153 lb (69.4 kg)  03/18/20 155 lb (70.3 kg)   10/03/19 159 lb (72.1 kg)   She is really working on diet and exercise, has lost a few pounds and is very pleased  She has history of diverticulitis, seen on CT scan in 2013.  This was long enough ago that she does not remember the symptoms all that well  Patient Active Problem List   Diagnosis Date Noted  . Osteoporosis 08/30/2016  . Overweight 08/22/2016  . Hypertension 04/08/2012  . Diverticulitis large intestine 03/23/2012  . Vitamin D deficiency 03/14/2012  . Heart murmur 03/06/2012    Past Medical History:  Diagnosis Date  . Anxiety   . GERD (gastroesophageal reflux disease)   . Hemorrhoids   . History of colonic diverticulitis   . Hypertension   . Pneumonia 02/2012    Past Surgical History:  Procedure Laterality Date  . ABDOMINAL HYSTERECTOMY    . bunyonectomy      Social History   Tobacco Use  . Smoking status: Never Smoker  . Smokeless tobacco: Never Used  Vaping Use  . Vaping Use: Never used  Substance Use Topics  . Alcohol use: Yes    Comment: occasionally  . Drug use: Never    Family History  Problem Relation Age of Onset  . Heart failure Father   . Arthritis Mother     No Known Allergies  Medication list has been reviewed and updated.  Current Outpatient Medications on File Prior to Visit  Medication Sig Dispense Refill  . alendronate (FOSAMAX) 70 MG tablet Take 1 tablet (70 mg total) by mouth every 7 (seven) days. Take with a full glass of water on an empty stomach. 12 tablet 3  . FLUoxetine (PROZAC) 20 MG capsule Take 1 capsule (20 mg total) by mouth daily. Can increase to 40 mg after 2-3 weeks 60 capsule 5  . lovastatin (MEVACOR) 20 MG tablet Take 1 tablet (20 mg total) by mouth at bedtime. 90 tablet 3  . lisinopril-hydrochlorothiazide (ZESTORETIC) 10-12.5 MG tablet Take 1 tablet by mouth daily. (Patient not taking: Reported on 04/16/2020) 90 tablet 3   No current facility-administered medications on file prior to visit.    Review of  Systems:  As per HPI- otherwise negative.   Physical Examination: Vitals:   04/16/20 1458  BP: 128/82  Pulse: 85  Resp: 17  Temp: 98.1 F (36.7 C)  SpO2: 97%   Vitals:   04/16/20 1458  Weight: 153 lb (69.4 kg)  Height: 5' (1.524 m)   Body mass index is 29.88 kg/m. Ideal Body Weight: Weight in (lb) to have BMI = 25: 127.7  GEN: no acute distress.  Overweight, looks well HEENT: Atraumatic, Normocephalic.  Ears and Nose: No external deformity. CV: RRR, No M/G/R. No JVD. No thrill. No extra heart sounds. PULM: CTA B, no wheezes, crackles, rhonchi. No retractions. No resp. distress. No accessory muscle use. ABD: S, ND, +BS. No rebound. No HSM.  Minimal LLQ tenderness on exam today  EXTR: No c/c/e PSYCH: Normally interactive. Conversant.    Assessment and Plan: Left lower quadrant pain - Plan: amoxicillin-clavulanate (AUGMENTIN) 875-125 MG tablet   Patient seen today with left lower quadrant pain for 4 days.  Seems to be getting better and is not severe.  We suspect she has mild diverticulitis based on previous history and exam findings.  I have asked her to go on a soft, low residue diet for 2 to 3 days, liquids only also okay.  We provided prescription for Augmentin, she is not sure if she wishes to start this since she is improving, but she can start at any point.  I have asked her to let me know if not better by Monday, sooner if getting worse over the weekend  This visit occurred during the SARS-CoV-2 public health emergency.  Safety protocols were in place, including screening questions prior to the visit, additional usage of staff PPE, and extensive cleaning of exam room while observing appropriate contact time as indicated for disinfecting solutions.    Signed Abbe Amsterdam, MD

## 2020-04-16 NOTE — Telephone Encounter (Signed)
Please let me know what time you like to see patient.

## 2020-04-16 NOTE — Patient Instructions (Addendum)
Good to see you today!  I am glad that the medication is helping him  We suspect you have mild diverticulitis today.  Fortunately, it seems to be getting better already.  I would recommend going on a soft, easy to digest diet for the next 2 or 3 days-example foods would be scrambled eggs, bananas, applesauce, chicken noodle soup If you do not feel hungry, just liquids is also okay  Since your symptoms are so mild, diet alone may clear up your diverticulitis.  However, you can certainly start taking the antibiotic as well at any time.  If you are getting worse, or if you are not back to normal by Monday please let me know

## 2020-04-20 ENCOUNTER — Encounter: Payer: Self-pay | Admitting: Family Medicine

## 2020-05-18 ENCOUNTER — Encounter: Payer: Self-pay | Admitting: Family Medicine

## 2020-06-09 DIAGNOSIS — H5213 Myopia, bilateral: Secondary | ICD-10-CM | POA: Diagnosis not present

## 2020-06-09 DIAGNOSIS — H2513 Age-related nuclear cataract, bilateral: Secondary | ICD-10-CM | POA: Diagnosis not present

## 2020-07-07 ENCOUNTER — Telehealth: Payer: Self-pay | Admitting: Family Medicine

## 2020-07-07 NOTE — Telephone Encounter (Signed)
LVM AWV been change to 10/01/20 @11am  with 

## 2020-09-17 ENCOUNTER — Other Ambulatory Visit: Payer: Self-pay | Admitting: Family Medicine

## 2020-09-22 NOTE — Progress Notes (Addendum)
[facility] [address], [address] [place], [place] [address] [phone]  Date:  09/24/2020   Name:  Victoria Hogan   DOB:  November 11, 1950   MRN:  [id number]  PCP:  Pearline Cables, MD    Chief Complaint: Dysuria (Pain with urination, slight blood ) and Cough (Frequent bronchitis, cough worse at night, non productive, would like cough suppressant for night)   History of Present Illness:  Victoria Hogan is a 70 y.o. very pleasant female patient who presents with the following:  Patient seen today with concern of UTI-history of hypertension and diverticulitis, vitamin D deficiency, osteoporosis, hyperlipidemia, depression Last visit with myself in December  COVID-19 fourth dose- recommended  Can update routine labs today if desired- she would like to go ahead and do this   Fosamax Fluoxetine- she stopped using this in February and is doing ok without it right now Lisinopril HCTZ-not taking currently Lovastatin  She is here today with concern of possible UTI Her sx started about a week ago- she noticed dysuria and pink tinge to her urine  Also urinary frequency  No vomiting or diarrhea No belly pain or fever  She used cranberry juice and her sx resolved over the last 2 days or so   She had a similar episode about a month ago and was able to clear it up at home- no hematuria at that time however  mammo due this summer-reminded patient  Previously her blood pressure readings have been too low, she is not currently taking her low-dose lisinopril/HCTZ.  Blood pressure is elevated today.  She does have a home blood pressure cuff BP Readings from Last 3 Encounters:  09/24/20 (!) 152/92  04/16/20 128/82  03/18/20 122/80     Patient Active Problem List   Diagnosis Date Noted  . Osteoporosis 08/30/2016  . Overweight 08/22/2016  . Hypertension 04/08/2012  . Diverticulitis large intestine 03/23/2012  . Vitamin D deficiency  03/14/2012  . Heart murmur 03/06/2012    Past Medical History:  Diagnosis Date  . Anxiety   . GERD (gastroesophageal reflux disease)   . Hemorrhoids   . History of colonic diverticulitis   . Hypertension   . Pneumonia 02/2012    Past Surgical History:  Procedure Laterality Date  . ABDOMINAL HYSTERECTOMY    . bunyonectomy      Social History   Tobacco Use  . Smoking status: Never Smoker  . Smokeless tobacco: Never Used  Vaping Use  . Vaping Use: Never used  Substance Use Topics  . Alcohol use: Yes    Comment: occasionally  . Drug use: Never    Family History  Problem Relation Age of Onset  . Heart failure Father   . Arthritis Mother     No Known Allergies  Medication list has been reviewed and updated.  Current Outpatient Medications on File Prior to Visit  Medication Sig Dispense Refill  . alendronate (FOSAMAX) 70 MG tablet TAKE 1 TABLET (70 MG TOTAL) BY MOUTH EVERY 7 (SEVEN) DAYS. TAKE WITH A FULL GLASS OF WATER ON AN EMPTY STOMACH. 12 tablet 3  . amoxicillin-clavulanate (AUGMENTIN) 875-125 MG tablet Take 1 tablet by mouth 2 (two) times daily. 20 tablet 0  . FLUoxetine (PROZAC) 20 MG capsule Take 1 capsule (20 mg total) by mouth daily. Can increase to 40 mg after 2-3 weeks 60 capsule 5  . lisinopril-hydrochlorothiazide (ZESTORETIC) 10-12.5 MG tablet Take 1 tablet by mouth  daily. 90 tablet 3  . lovastatin (MEVACOR) 20 MG tablet Take 1 tablet (20 mg total) by mouth at bedtime. 90 tablet 3   No current facility-administered medications on file prior to visit.    Review of Systems:  As per HPI- otherwise negative.   Physical Examination: Vitals:   09/24/20 1405  BP: (!) 152/92  Pulse: 95  Resp: 16  Temp: 98 F (36.7 C)  SpO2: 96%   Vitals:   09/24/20 1405  Weight: 153 lb (69.4 kg)  Height: 5' (1.524 m)   Body mass index is 29.88 kg/m. Ideal Body Weight: Weight in (lb) to have BMI = 25: 127.7  GEN: no acute distress.  Mild overweight, looks  well HEENT: Atraumatic, Normocephalic.  Ears and Nose: No external deformity. CV: RRR, No M/G/R. No JVD. No thrill. No extra heart sounds. PULM: CTA B, no wheezes, crackles, rhonchi. No retractions. No resp. distress. No accessory muscle use. ABD: S, NT, ND, +BS. No rebound. No HSM.  Benign belly, no CVA tenderness EXTR: No c/c/e PSYCH: Normally interactive. Conversant.   BP Readings from Last 3 Encounters:  09/24/20 (!) 152/92  04/16/20 128/82  03/18/20 122/80    Assessment and Plan: Essential hypertension - Plan: CBC, Comprehensive metabolic panel  Screening for colon cancer - Plan: Ambulatory referral to Gastroenterology  Dyslipidemia - Plan: Lipid panel  Screening for diabetes mellitus - Plan: Comprehensive metabolic panel, Hemoglobin A1c  Screening for deficiency anemia - Plan: CBC  Screening for thyroid disorder - Plan: TSH  Vitamin D deficiency - Plan: VITAMIN D 25 Hydroxy (Vit-D Deficiency, Fractures)  Dysuria - Plan: Urine Culture Patient today with concern of possible UTI and gross hematuria.  Urine culture is pending.  She has never been a smoker.  If urine culture negative will need further evaluation for hematuria  Blood pressure is slightly high today.  I have asked her to check her blood pressures at home and give me an update in 1 to 2 weeks.  If need be we can put her back on medication  Referral to GI for colonoscopy  Will plan further follow- up pending labs.  This visit occurred during the SARS-CoV-2 public health emergency.  Safety protocols were in place, including screening questions prior to the visit, additional usage of staff PPE, and extensive cleaning of exam room while observing appropriate contact time as indicated for disinfecting solutions.    Signed Abbe Amsterdam, MD   Received her labs 5/20, message to patient Urine culture still pending Results for orders placed or performed in visit on 09/24/20  CBC  Result Value Ref Range   WBC  5.2 4.0 - 10.5 K/uL   RBC 4.77 3.87 - 5.11 Mil/uL   Platelets 291.0 150.0 - 400.0 K/uL   Hemoglobin 14.7 12.0 - 15.0 g/dL   HCT 35.3 29.9 - 24.2 %   MCV 90.8 78.0 - 100.0 fl   MCHC 33.9 30.0 - 36.0 g/dL   RDW 68.3 41.9 - 62.2 %  Comprehensive metabolic panel  Result Value Ref Range   Sodium 138 135 - 145 mEq/L   Potassium 4.2 3.5 - 5.1 mEq/L   Chloride 102 96 - 112 mEq/L   CO2 23 19 - 32 mEq/L   Glucose, Bld 100 (H) 70 - 99 mg/dL   BUN 15 6 - 23 mg/dL   Creatinine, Ser 2.97 0.40 - 1.20 mg/dL   Total Bilirubin 0.7 0.2 - 1.2 mg/dL   Alkaline Phosphatase 75 39 - 117 U/L  AST 18 0 - 37 U/L   ALT 15 0 - 35 U/L   Total Protein 7.6 6.0 - 8.3 g/dL   Albumin 4.6 3.5 - 5.2 g/dL   GFR 16.10 >96.04 mL/min   Calcium 9.8 8.4 - 10.5 mg/dL  Hemoglobin V4U  Result Value Ref Range   Hgb A1c MFr Bld 5.7 4.6 - 6.5 %  Lipid panel  Result Value Ref Range   Cholesterol 199 0 - 200 mg/dL   Triglycerides 98.1 0.0 - 149.0 mg/dL   HDL 19.14 >78.29 mg/dL   VLDL 56.2 0.0 - 13.0 mg/dL   LDL Cholesterol 865 (H) 0 - 99 mg/dL   Total CHOL/HDL Ratio 4    NonHDL 150.37   TSH  Result Value Ref Range   TSH 1.34 0.35 - 4.50 uIU/mL  VITAMIN D 25 Hydroxy (Vit-D Deficiency, Fractures)  Result Value Ref Range   VITD 20.17 (L) 30.00 - 100.00 ng/mL   addnd 5/22- received her urine culture as below-message to pt MICRO NUMBER: [id number]   SPECIMEN QUALITY: Adequate   Sample Source NOT GIVEN   STATUS: FINAL   Result: Mixed genital flora isolated. These superficial bacteria are not indicative of a urinary tract infection. No further organism identification is warranted on this specimen. If clinically indicated, recollect clean-catch, mid-stream urine and transfer  immediately to Urine Culture Transport Tube.

## 2020-09-22 NOTE — Patient Instructions (Addendum)
It was great to see you again today!  I will be in touch with your labs and your urine culture Please check your BP at home a few times over the next couple of weeks- BP goal is less than 140/85.  If you find you are generally higher than these numbers please let me know!

## 2020-09-24 ENCOUNTER — Encounter: Payer: Self-pay | Admitting: Family Medicine

## 2020-09-24 ENCOUNTER — Ambulatory Visit (INDEPENDENT_AMBULATORY_CARE_PROVIDER_SITE_OTHER): Payer: Medicare PPO | Admitting: Family Medicine

## 2020-09-24 ENCOUNTER — Other Ambulatory Visit: Payer: Self-pay

## 2020-09-24 VITALS — BP 152/92 | HR 95 | Temp 98.0°F | Resp 16 | Ht 60.0 in | Wt 153.0 lb

## 2020-09-24 DIAGNOSIS — Z1211 Encounter for screening for malignant neoplasm of colon: Secondary | ICD-10-CM

## 2020-09-24 DIAGNOSIS — Z131 Encounter for screening for diabetes mellitus: Secondary | ICD-10-CM | POA: Diagnosis not present

## 2020-09-24 DIAGNOSIS — Z130 Encounter for screening for diseases of the blood and blood-forming organs and certain disorders involving the immune mechanism: Secondary | ICD-10-CM | POA: Diagnosis not present

## 2020-09-24 DIAGNOSIS — E559 Vitamin D deficiency, unspecified: Secondary | ICD-10-CM

## 2020-09-24 DIAGNOSIS — Z1329 Encounter for screening for other suspected endocrine disorder: Secondary | ICD-10-CM

## 2020-09-24 DIAGNOSIS — I10 Essential (primary) hypertension: Secondary | ICD-10-CM

## 2020-09-24 DIAGNOSIS — R300 Dysuria: Secondary | ICD-10-CM | POA: Diagnosis not present

## 2020-09-24 DIAGNOSIS — E785 Hyperlipidemia, unspecified: Secondary | ICD-10-CM | POA: Diagnosis not present

## 2020-09-24 DIAGNOSIS — R310 Gross hematuria: Secondary | ICD-10-CM

## 2020-09-24 MED ORDER — BENZONATATE 100 MG PO CAPS
100.0000 mg | ORAL_CAPSULE | Freq: Two times a day (BID) | ORAL | 1 refills | Status: DC | PRN
Start: 2020-09-24 — End: 2020-10-29

## 2020-09-25 ENCOUNTER — Encounter: Payer: Self-pay | Admitting: Family Medicine

## 2020-09-25 LAB — VITAMIN D 25 HYDROXY (VIT D DEFICIENCY, FRACTURES): VITD: 20.17 ng/mL — ABNORMAL LOW (ref 30.00–100.00)

## 2020-09-25 LAB — LIPID PANEL
Cholesterol: 199 mg/dL (ref 0–200)
HDL: 48.2 mg/dL (ref >39.00)
LDL Cholesterol: 136 mg/dL — ABNORMAL HIGH (ref 0–99)
NonHDL: 150.37
Total CHOL/HDL Ratio: 4
Triglycerides: 71 mg/dL (ref 0.0–149.0)
VLDL: 14.2 mg/dL (ref 0.0–40.0)

## 2020-09-25 LAB — COMPREHENSIVE METABOLIC PANEL
ALT: 15 U/L (ref 0–35)
AST: 18 U/L (ref 0–37)
Albumin: 4.6 g/dL (ref 3.5–5.2)
Alkaline Phosphatase: 75 U/L (ref 39–117)
BUN: 15 mg/dL (ref 6–23)
CO2: 23 mEq/L (ref 19–32)
Calcium: 9.8 mg/dL (ref 8.4–10.5)
Chloride: 102 mEq/L (ref 96–112)
Creatinine, Ser: 0.72 mg/dL (ref 0.40–1.20)
GFR: 84.93 mL/min (ref >60.00)
Glucose, Bld: 100 mg/dL — ABNORMAL HIGH (ref 70–99)
Potassium: 4.2 mEq/L (ref 3.5–5.1)
Sodium: 138 mEq/L (ref 135–145)
Total Bilirubin: 0.7 mg/dL (ref 0.2–1.2)
Total Protein: 7.6 g/dL (ref 6.0–8.3)

## 2020-09-25 LAB — CBC
HCT: 43.3 % (ref 36.0–46.0)
Hemoglobin: 14.7 g/dL (ref 12.0–15.0)
MCHC: 33.9 g/dL (ref 30.0–36.0)
MCV: 90.8 fl (ref 78.0–100.0)
Platelets: 291 10*3/uL (ref 150.0–400.0)
RBC: 4.77 Mil/uL (ref 3.87–5.11)
RDW: 12.9 % (ref 11.5–15.5)
WBC: 5.2 10*3/uL (ref 4.0–10.5)

## 2020-09-25 LAB — URINE CULTURE
MICRO NUMBER:: 11911215
SPECIMEN QUALITY:: ADEQUATE

## 2020-09-25 LAB — HEMOGLOBIN A1C: Hgb A1c MFr Bld: 5.7 % (ref 4.6–6.5)

## 2020-09-25 LAB — TSH: TSH: 1.34 u[IU]/mL (ref 0.35–4.50)

## 2020-09-25 MED ORDER — VITAMIN D3 1.25 MG (50000 UT) PO CAPS: ORAL_CAPSULE | ORAL | 0 refills | Status: DC

## 2020-09-25 NOTE — Addendum Note (Signed)
Addended by: Abbe Amsterdam C on: 09/25/2020 12:56 PM   Modules accepted: Orders

## 2020-09-27 ENCOUNTER — Encounter: Payer: Self-pay | Admitting: Family Medicine

## 2020-09-27 NOTE — Addendum Note (Signed)
Addended by: Abbe Amsterdam C on: 09/27/2020 06:30 AM   Modules accepted: Orders

## 2020-09-28 ENCOUNTER — Ambulatory Visit: Payer: Medicare PPO | Admitting: *Deleted

## 2020-10-01 ENCOUNTER — Ambulatory Visit: Payer: Medicare PPO

## 2020-10-12 ENCOUNTER — Ambulatory Visit: Payer: Medicare PPO

## 2020-10-12 NOTE — Progress Notes (Signed)
   Covid-19 Vaccination Clinic  Name:  LEVETTA BOGNAR    MRN: [id number] DOB: 07/08/1950  10/12/2020  Ms. Hickey was observed post Covid-19 immunization for 15 minutes without incident. She was provided with Vaccine Information Sheet and instruction to access the V-Safe system.   Ms. Minahan was instructed to call 911 with any severe reactions post vaccine: Marland Kitchen Difficulty breathing  . Swelling of face and throat  . A fast heartbeat  . A bad rash all over body  . Dizziness and weakness

## 2020-10-13 ENCOUNTER — Other Ambulatory Visit (HOSPITAL_BASED_OUTPATIENT_CLINIC_OR_DEPARTMENT_OTHER): Payer: Self-pay

## 2020-10-13 MED ORDER — PFIZER-BIONT COVID-19 VAC-TRIS 30 MCG/0.3ML IM SUSP
INTRAMUSCULAR | 0 refills | Status: DC
  Filled 2020-10-13: qty 0.3, 1d supply, fill #0

## 2020-10-21 ENCOUNTER — Other Ambulatory Visit: Payer: Self-pay | Admitting: Family Medicine

## 2020-10-22 MED ORDER — BENZONATATE 200 MG PO CAPS
200.0000 mg | ORAL_CAPSULE | Freq: Two times a day (BID) | ORAL | 1 refills | Status: DC | PRN
Start: 2020-10-22 — End: 2020-12-29

## 2020-10-22 NOTE — Telephone Encounter (Signed)
Tessalon 100mg  on back order, pharmacy asking if they can change to 200mg ?

## 2020-10-29 ENCOUNTER — Ambulatory Visit (INDEPENDENT_AMBULATORY_CARE_PROVIDER_SITE_OTHER): Payer: Medicare PPO

## 2020-10-29 ENCOUNTER — Other Ambulatory Visit: Payer: Self-pay

## 2020-10-29 VITALS — BP 148/94 | HR 58 | Temp 97.5°F | Resp 16 | Ht 60.0 in | Wt 156.8 lb

## 2020-10-29 DIAGNOSIS — Z1231 Encounter for screening mammogram for malignant neoplasm of breast: Secondary | ICD-10-CM

## 2020-10-29 DIAGNOSIS — Z0000 Encounter for general adult medical examination without abnormal findings: Secondary | ICD-10-CM

## 2020-10-29 NOTE — Patient Instructions (Signed)
Victoria Hogan , Thank you for taking time to come for your Medicare Wellness Visit. I appreciate your ongoing commitment to your health goals. Please review the following plan we discussed and let me know if I can assist you in the future.   Screening recommendations/referrals: Colonoscopy: Completed 10/2015-No report in chart. Per our conversation, you will continue to try to obtain report of last colonoscopy. Mammogram: Ordered today. Someone will be calling you to schedule. Bone Density: Completed 10/2019-Due 10/2021 Recommended yearly ophthalmology/optometry visit for glaucoma screening and checkup Recommended yearly dental visit for hygiene and checkup  Vaccinations: Influenza vaccine: Up to date Pneumococcal vaccine: Up to date Tdap vaccine: Up to date-Due-12/06/2022 Shingles vaccine: Completed vaccines   Covid-19:Up to date  Advanced directives: Information given today  Conditions/risks identified: See problem list  Next appointment: Follow up in one year for your annual wellness visit 11/03/2021 @ 9:00   Preventive Care 65 Years and Older, Female Preventive care refers to lifestyle choices and visits with your health care provider that can promote health and wellness. What does preventive care include? A yearly physical exam. This is also called an annual well check. Dental exams once or twice a year. Routine eye exams. Ask your health care provider how often you should have your eyes checked. Personal lifestyle choices, including: Daily care of your teeth and gums. Regular physical activity. Eating a healthy diet. Avoiding tobacco and drug use. Limiting alcohol use. Practicing safe sex. Taking low-dose aspirin every day. Taking vitamin and mineral supplements as recommended by your health care provider. What happens during an annual well check? The services and screenings done by your health care provider during your annual well check will depend on your age, overall  health, lifestyle risk factors, and family history of disease. Counseling  Your health care provider may ask you questions about your: Alcohol use. Tobacco use. Drug use. Emotional well-being. Home and relationship well-being. Sexual activity. Eating habits. History of falls. Memory and ability to understand (cognition). Work and work Astronomer. Reproductive health. Screening  You may have the following tests or measurements: Height, weight, and BMI. Blood pressure. Lipid and cholesterol levels. These may be checked every 5 years, or more frequently if you are over 6 years old. Skin check. Lung cancer screening. You may have this screening every year starting at age 81 if you have a 30-pack-year history of smoking and currently smoke or have quit within the past 15 years. Fecal occult blood test (FOBT) of the stool. You may have this test every year starting at age 57. Flexible sigmoidoscopy or colonoscopy. You may have a sigmoidoscopy every 5 years or a colonoscopy every 10 years starting at age 62. Hepatitis C blood test. Hepatitis B blood test. Sexually transmitted disease (STD) testing. Diabetes screening. This is done by checking your blood sugar (glucose) after you have not eaten for a while (fasting). You may have this done every 1-3 years. Bone density scan. This is done to screen for osteoporosis. You may have this done starting at age 10. Mammogram. This may be done every 1-2 years. Talk to your health care provider about how often you should have regular mammograms. Talk with your health care provider about your test results, treatment options, and if necessary, the need for more tests. Vaccines  Your health care provider may recommend certain vaccines, such as: Influenza vaccine. This is recommended every year. Tetanus, diphtheria, and acellular pertussis (Tdap, Td) vaccine. You may need a Td booster every 10 years. Zoster  vaccine. You may need this after age  50. Pneumococcal 13-valent conjugate (PCV13) vaccine. One dose is recommended after age 83. Pneumococcal polysaccharide (PPSV23) vaccine. One dose is recommended after age 54. Talk to your health care provider about which screenings and vaccines you need and how often you need them. This information is not intended to replace advice given to you by your health care provider. Make sure you discuss any questions you have with your health care provider. Document Released: 05/22/2015 Document Revised: 01/13/2016 Document Reviewed: 02/24/2015 Elsevier Interactive Patient Education  2017 [place] Prevention in the Home Falls can cause injuries. They can happen to people of all ages. There are many things you can do to make your home safe and to help prevent falls. What can I do on the outside of my home? Regularly fix the edges of walkways and driveways and fix any cracks. Remove anything that might make you trip as you walk through a door, such as a raised step or threshold. Trim any bushes or trees on the path to your home. Use bright outdoor lighting. Clear any walking paths of anything that might make someone trip, such as rocks or tools. Regularly check to see if handrails are loose or broken. Make sure that both sides of any steps have handrails. Any raised decks and porches should have guardrails on the edges. Have any leaves, snow, or ice cleared regularly. Use sand or salt on walking paths during winter. Clean up any spills in your garage right away. This includes oil or grease spills. What can I do in the bathroom? Use night lights. Install grab bars by the toilet and in the tub and shower. Do not use towel bars as grab bars. Use non-skid mats or decals in the tub or shower. If you need to sit down in the shower, use a plastic, non-slip stool. Keep the floor dry. Clean up any water that spills on the floor as soon as it happens. Remove soap buildup in the tub or shower  regularly. Attach bath mats securely with double-sided non-slip rug tape. Do not have throw rugs and other things on the floor that can make you trip. What can I do in the bedroom? Use night lights. Make sure that you have a light by your bed that is easy to reach. Do not use any sheets or blankets that are too big for your bed. They should not hang down onto the floor. Have a firm chair that has side arms. You can use this for support while you get dressed. Do not have throw rugs and other things on the floor that can make you trip. What can I do in the kitchen? Clean up any spills right away. Avoid walking on wet floors. Keep items that you use a lot in easy-to-reach places. If you need to reach something above you, use a strong step stool that has a grab bar. Keep electrical cords out of the way. Do not use floor polish or wax that makes floors slippery. If you must use wax, use non-skid floor wax. Do not have throw rugs and other things on the floor that can make you trip. What can I do with my stairs? Do not leave any items on the stairs. Make sure that there are handrails on both sides of the stairs and use them. Fix handrails that are broken or loose. Make sure that handrails are as long as the stairways. Check any carpeting to make sure that it  is firmly attached to the stairs. Fix any carpet that is loose or worn. Avoid having throw rugs at the top or bottom of the stairs. If you do have throw rugs, attach them to the floor with carpet tape. Make sure that you have a light switch at the top of the stairs and the bottom of the stairs. If you do not have them, ask someone to add them for you. What else can I do to help prevent falls? Wear shoes that: Do not have high heels. Have rubber bottoms. Are comfortable and fit you well. Are closed at the toe. Do not wear sandals. If you use a stepladder: Make sure that it is fully opened. Do not climb a closed stepladder. Make sure that  both sides of the stepladder are locked into place. Ask someone to hold it for you, if possible. Clearly mark and make sure that you can see: Any grab bars or handrails. First and last steps. Where the edge of each step is. Use tools that help you move around (mobility aids) if they are needed. These include: Canes. Walkers. Scooters. Crutches. Turn on the lights when you go into a dark area. Replace any light bulbs as soon as they burn out. Set up your furniture so you have a clear path. Avoid moving your furniture around. If any of your floors are uneven, fix them. If there are any pets around you, be aware of where they are. Review your medicines with your doctor. Some medicines can make you feel dizzy. This can increase your chance of falling. Ask your doctor what other things that you can do to help prevent falls. This information is not intended to replace advice given to you by your health care provider. Make sure you discuss any questions you have with your health care provider. Document Released: 02/19/2009 Document Revised: 10/01/2015 Document Reviewed: 05/30/2014 Elsevier Interactive Patient Education  2017 [employer].

## 2020-10-29 NOTE — Progress Notes (Addendum)
Subjective:   Victoria Hogan is a 70 y.o. female who presents for Medicare Annual (Subsequent) preventive examination.  Review of Systems     Cardiac Risk Factors include: advanced age (>58men, >64 women);dyslipidemia;hypertension;obesity (BMI >30kg/m2)     Objective:    Today's Vitals   10/29/20 1100  BP: (!) 148/94  Pulse: (!) 58  Resp: 16  Temp: (!) 97.5 F (36.4 Hogan)  TempSrc: Temporal  SpO2: 99%  Weight: 156 lb 12.8 oz (71.1 kg)  Height: 5' (1.524 m)   Body mass index is 30.62 kg/m.  Advanced Directives 10/29/2020 09/26/2019 09/14/2018 09/12/2017 03/06/2012  Does Patient Have a Medical Advance Directive? No No No No Patient does not have advance directive  Does patient want to make changes to medical advance directive? Yes (MAU/Ambulatory/Procedural Areas - Information given) - - - -  Would patient like information on creating a medical advance directive? - No - Patient declined No - Patient declined Yes (MAU/Ambulatory/Procedural Areas - Information given) -  Pre-existing out of facility DNR order (yellow form or pink MOST form) - - - - No    Current Medications (verified) Outpatient Encounter Medications as of 10/29/2020  Medication Sig   benzonatate (TESSALON) 200 MG capsule Take 1 capsule (200 mg total) by mouth 2 (two) times daily as needed for cough.   Cholecalciferol (VITAMIN D3) 1.25 MG (50000 UT) CAPS Take 1 weekly for 12 weeks   FLUoxetine (PROZAC) 20 MG capsule Take 1 capsule (20 mg total) by mouth daily. Can increase to 40 mg after 2-3 weeks   lisinopril-hydrochlorothiazide (ZESTORETIC) 10-12.5 MG tablet Take 1 tablet by mouth daily.   alendronate (FOSAMAX) 70 MG tablet TAKE 1 TABLET (70 MG TOTAL) BY MOUTH EVERY 7 (SEVEN) DAYS. TAKE WITH A FULL GLASS OF WATER ON AN EMPTY STOMACH.   lovastatin (MEVACOR) 20 MG tablet Take 1 tablet (20 mg total) by mouth at bedtime.   [DISCONTINUED] amoxicillin-clavulanate (AUGMENTIN) 875-125 MG tablet Take 1 tablet by mouth 2 (two)  times daily.   [DISCONTINUED] benzonatate (TESSALON) 100 MG capsule Take 1 capsule (100 mg total) by mouth 2 (two) times daily as needed for cough.   [DISCONTINUED] COVID-19 mRNA Vac-TriS, Pfizer, (PFIZER-BIONT COVID-19 VAC-TRIS) SUSP injection Inject into the muscle.   No facility-administered encounter medications on file as of 10/29/2020.    Allergies (verified) Patient has no known allergies.   History: Past Medical History:  Diagnosis Date   Anxiety    GERD (gastroesophageal reflux disease)    Hemorrhoids    History of colonic diverticulitis    Hypertension    Pneumonia 02/2012   Past Surgical History:  Procedure Laterality Date   ABDOMINAL HYSTERECTOMY     bunyonectomy     Family History  Problem Relation Age of Onset   Heart failure Father    Arthritis Mother    Social History   Socioeconomic History   Marital status: Divorced    Spouse name: Not on file   Number of children: Not on file   Years of education: college   Highest education level: Not on file  Occupational History   Occupation: Magazine features editor: [employer]  Tobacco Use   Smoking status: Never   Smokeless tobacco: Never  Vaping Use   Vaping Use: Never used  Substance and Sexual Activity   Alcohol use: Yes    Comment: occasionally   Drug use: Never   Sexual activity: Not Currently  Other Topics Concern   Not on  file  Social History Narrative   Bikes daily for exercise.   Social Determinants of Health   Financial Resource Strain: Low Risk    Difficulty of Paying Living Expenses: Not hard at all  Food Insecurity: No Food Insecurity   Worried About Programme researcher, broadcasting/film/videounning Out of Food in the Last Year: Never true   Ran Out of Food in the Last Year: Never true  Transportation Needs: No Transportation Needs   Lack of Transportation (Medical): No   Lack of Transportation (Non-Medical): No  Physical Activity: Insufficiently Active   Days of Exercise per Week: 5 days   Minutes of Exercise  per Session: 20 min  Stress: No Stress Concern Present   Feeling of Stress : Not at all  Social Connections: Moderately Isolated   Frequency of Communication with Friends and Family: More than three times a week   Frequency of Social Gatherings with Friends and Family: More than three times a week   Attends Religious Services: More than 4 times per year   Active Member of [employer]ubs or Organizations: No   Attends Engineer, structuralClub or Organization Meetings: Never   Marital Status: Divorced    Tobacco Counseling Counseling given: Not Answered   Clinical Intake:  Pre-visit preparation completed: Yes  Pain : No/denies pain     Nutritional Status: BMI > 30  Obese Nutritional Risks: None Diabetes: No  How often do you need to have someone help you when you read instructions, pamphlets, or other written materials from your doctor or pharmacy?: 1 - Never  Diabetic?No  Interpreter Needed?: No  Information entered by :: Victoria Hogan   Activities of Daily Living In your present state of health, do you have any difficulty performing the following activities: 10/29/2020  Hearing? N  Vision? N  Difficulty concentrating or making decisions? N  Walking or climbing stairs? N  Dressing or bathing? N  Doing errands, shopping? N  Preparing Food and eating ? N  Using the Toilet? N  In the past six months, have you accidently leaked urine? N  Do you have problems with loss of bowel control? N  Managing your Medications? N  Managing your Finances? N  Housekeeping or managing your Housekeeping? N  Some recent data might be hidden    Patient Care Team: Copland, Gwenlyn FoundJessica C, MD as PCP - General (Family Medicine) Victoria HawkingHung, Patrick, MD as Attending Physician (Gastroenterology)  Indicate any recent Medical Services you may have received from other than [facility] providers in the past year (date may be approximate).     Assessment:   This is a routine wellness examination for [name].  Hearing/Vision  screen Hearing Screening - Comments:: No issues Vision Screening - Comments:: Last eye exam-05/2020-Victoria Hogan glasses for reading &amp; glasses  Dietary issues and exercise activities discussed: Current Exercise Habits: Home exercise routine, Type of exercise: walking, Time (Minutes): 25, Frequency (Times/Week): 5, Weekly Exercise (Minutes/Week): 125, Intensity: Mild, Exercise limited by: None identified   Goals Addressed             This Visit's Progress    Increase physical activity   On track      Depression Screen PHQ 2/9 Scores 10/29/2020 09/26/2019 09/14/2018 09/12/2017 10/22/2015 10/20/2015 02/09/2015  PHQ - 2 Score 0 0 0 0 0 0 0  PHQ- 9 Score - - - - - - -    Fall Risk Fall Risk  10/29/2020 09/26/2019 09/14/2018 09/12/2017 10/22/2015  Falls in the past year? 0 0 0 No No  Number falls in past yr: 0 0 - - -  Comment - - - - -  Injury with Fall? 0 0 - - -  Follow up Falls prevention discussed Education provided;Falls prevention discussed - - -    FALL RISK PREVENTION PERTAINING TO THE HOME:  Any stairs in or around the home? No  Home free of loose throw rugs in walkways, pet beds, electrical cords, etc? Yes  Adequate lighting in your home to reduce risk of falls? Yes   ASSISTIVE DEVICES UTILIZED TO PREVENT FALLS:  Life alert? No  Use of a cane, walker or w/Hogan? No  Grab bars in the bathroom? Yes  Shower chair or bench in shower? No  Elevated toilet seat or a handicapped toilet? No   TIMED UP AND GO:  Was the test performed? Yes .  Length of time to ambulate 10 feet: 10 sec.   Gait steady and fast without use of assistive device  Cognitive Function:Normal cognitive status assessed by direct observation by this Nurse Health Advisor. No abnormalities found.          Immunizations Immunization History  Administered Date(s) Administered   Fluad Quad(high Dose 65+) 01/12/2019, 03/18/2020   Influenza Split 03/07/2012   Influenza, High Dose Seasonal PF 02/01/2018    Influenza,inj,Quad PF,6+ Mos 04/10/2013, 02/21/2014, 02/09/2015   PFIZER Comirnaty(Gray Top)Covid-19 Tri-Sucrose Vaccine 10/12/2020   PFIZER(Purple Top)SARS-COV-2 Vaccination 06/23/2019, 07/07/2019, 04/15/2020   Pneumococcal Conjugate-13 08/22/2016   Pneumococcal Polysaccharide-23 03/07/2012   Tdap 12/05/2012   Zoster Recombinat (Shingrix) 01/12/2019, 04/13/2019    TDAP status: Up to date  Flu Vaccine status: Up to date  Pneumococcal vaccine status: Up to date  Covid-19 vaccine status: Completed vaccines  Qualifies for Shingles Vaccine? No   Zostavax completed Yes   Shingrix Completed?: Yes  Screening Tests Health Maintenance  Topic Date Due   COLONOSCOPY (Pts 45-59yrs Insurance coverage will need to be confirmed)  10/13/2020   INFLUENZA VACCINE  12/07/2020   MAMMOGRAM  10/16/2021   TETANUS/TDAP  12/06/2022   DEXA SCAN  Completed   COVID-19 Vaccine  Completed   Hepatitis Hogan Screening  Completed   PNA vac Low Risk Adult  Completed   Zoster Vaccines- Shingrix  Completed   HPV VACCINES  Aged Out    Health Maintenance  Health Maintenance Due  Topic Date Due   COLONOSCOPY (Pts 45-23yrs Insurance coverage will need to be confirmed)  10/13/2020    Colorectal cancer screening: Type of screening: Colonoscopy. Completed 10/2015. Repeat every 5-10 years No report in chart. Patient in process of obtaining report.  Mammogram status: Ordered today. Pt provided with contact info and advised to call to schedule appt.   Bone Density status: Completed 10/17/2019. Results reflect: Bone density results: OSTEOPOROSIS. Repeat every 2 years.  Lung Cancer Screening: (Low Dose CT Chest recommended if Age 42-80 years, 30 pack-year currently smoking OR have quit w/in 15years.) does not qualify.     Additional Screening:  Hepatitis Hogan Screening:  Completed 08/22/2016  Vision Screening: Recommended annual ophthalmology exams for early detection of glaucoma and other disorders of the eye. Is  the patient up to date with their annual eye exam?  Yes  Who is the provider or what is the name of the office in which the patient attends annual eye exams? Dr. Vanessa Kick   Dental Screening: Recommended annual dental exams for proper oral hygiene  Community Resource Referral / Chronic Care Management: CRR required this visit?  No   CCM required this  visit?  No      Plan:     I have personally reviewed and noted the following in the patient's chart:   Medical and social history Use of alcohol, tobacco or illicit drugs  Current medications and supplements including opioid prescriptions.  Functional ability and status Nutritional status Physical activity Advanced directives List of other physicians Hospitalizations, surgeries, and ER visits in previous 12 months Vitals Screenings to include cognitive, depression, and falls Referrals and appointments  In addition, I have reviewed and discussed with patient certain preventive protocols, quality metrics, and best practice recommendations. A written personalized care plan for preventive services as well as general preventive health recommendations were provided to patient.   Patient would like to access avs on mychart.  Roanna Raider, Hogan   [id number]  Nurse Health Advisor  Nurse Notes: None  Medical screening examination/treatment/procedure(s) were performed by non-physician practitioner and as supervising provider I was immediately available for consultation/collaboration.  I agree with above. Olive Bass, FNP

## 2020-12-07 ENCOUNTER — Encounter (HOSPITAL_BASED_OUTPATIENT_CLINIC_OR_DEPARTMENT_OTHER): Payer: Self-pay

## 2020-12-07 ENCOUNTER — Ambulatory Visit (HOSPITAL_BASED_OUTPATIENT_CLINIC_OR_DEPARTMENT_OTHER)
Admission: RE | Admit: 2020-12-07 | Discharge: 2020-12-07 | Disposition: A | Discharge status: Home / Self Care | Payer: Medicare PPO | Source: Ambulatory Visit | Attending: Family Medicine | Admitting: Family Medicine

## 2020-12-07 ENCOUNTER — Other Ambulatory Visit: Payer: Self-pay

## 2020-12-07 DIAGNOSIS — Z1231 Encounter for screening mammogram for malignant neoplasm of breast: Secondary | ICD-10-CM | POA: Insufficient documentation

## 2020-12-15 ENCOUNTER — Other Ambulatory Visit: Payer: Self-pay | Admitting: Family Medicine

## 2020-12-15 DIAGNOSIS — E559 Vitamin D deficiency, unspecified: Secondary | ICD-10-CM

## 2020-12-29 ENCOUNTER — Ambulatory Visit: Payer: Medicare PPO | Admitting: Gastroenterology

## 2020-12-29 ENCOUNTER — Encounter: Payer: Self-pay | Admitting: Gastroenterology

## 2020-12-29 VITALS — BP 136/86 | HR 84 | Ht 60.0 in | Wt 158.2 lb

## 2020-12-29 DIAGNOSIS — Z86010 Personal history of colonic polyps: Secondary | ICD-10-CM

## 2020-12-29 NOTE — Progress Notes (Signed)
HPI : Victoria Hogan is a very pleasant 70 year old female referred by Dr. Shanda Bumps Copland for surveillance colonoscopy.  Her last colonoscopy was in 2017 by Dr. Elnoria Howard and a single small polyp in the hepatic flexure was removed.  Histology was consistent with a hyperplastic polyp.  She was recommended to repeat colonoscopy in 5 years.  The patient does not have the records from her initial colonoscopy, but she thinks that it was in 2012.  She thinks she had a polyp, but does not recall ever being told that she needed to be concerned about the size or number of her polyps.  She has no family history of colon cancer.  She denies any chronic GI symptoms to include abdominal pain, constipation, diarrhea or blood in the stool.  She denies any upper GI symptoms to include frequent heartburn, acid regurgitation, nausea/vomiting or difficulty swallowing.   Past Medical History:  Diagnosis Date   Anxiety    GERD (gastroesophageal reflux disease)    Hemorrhoids    History of colonic diverticulitis    Hypertension    Pneumonia 02/2012     Past Surgical History:  Procedure Laterality Date   ABDOMINAL HYSTERECTOMY     bunyonectomy     Family History  Problem Relation Age of Onset   Heart failure Father    Arthritis Mother    Social History   Tobacco Use   Smoking status: Never   Smokeless tobacco: Never  Vaping Use   Vaping Use: Never used  Substance Use Topics   Alcohol use: Yes    Comment: occasionally   Drug use: Never   Current Outpatient Medications  Medication Sig Dispense Refill   alendronate (FOSAMAX) 70 MG tablet TAKE 1 TABLET (70 MG TOTAL) BY MOUTH EVERY 7 (SEVEN) DAYS. TAKE WITH A FULL GLASS OF WATER ON AN EMPTY STOMACH. 12 tablet 3   lisinopril-hydrochlorothiazide (ZESTORETIC) 10-12.5 MG tablet Take 1 tablet by mouth daily. 90 tablet 3   lovastatin (MEVACOR) 20 MG tablet Take 1 tablet (20 mg total) by mouth at bedtime. 90 tablet 3   No current facility-administered  medications for this visit.   No Known Allergies   Review of Systems: All systems reviewed and negative except where noted in HPI.    Mammogram 3D SCREEN BREAST BILATERAL  Result Date: 12/09/2020 CLINICAL DATA:  Screening. EXAM: DIGITAL SCREENING BILATERAL MAMMOGRAM WITH TOMOSYNTHESIS AND CAD TECHNIQUE: Bilateral screening digital craniocaudal and mediolateral oblique mammograms were obtained. Bilateral screening digital breast tomosynthesis was performed. The images were evaluated with computer-aided detection. COMPARISON:  Previous exam(s). ACR Breast Density Category b: There are scattered areas of fibroglandular density. FINDINGS: There are no findings suspicious for malignancy. IMPRESSION: No mammographic evidence of malignancy. A result letter of this screening mammogram will be mailed directly to the patient. RECOMMENDATION: Screening mammogram in one year. (Code:SM-B-01Y) BI-RADS CATEGORY  1: Negative. Electronically Signed   By: Emmaline Kluver M.D.   On: 12/09/2020 16:23    Physical Exam: BP 136/86   Pulse 84   Ht 5' (1.524 m)   Wt 158 lb 3.2 oz (71.8 kg)   BMI 30.90 kg/m  Constitutional: Pleasant,well-developed female in no acute distress. HEENT: Normocephalic and atraumatic. Conjunctivae are normal. No scleral icterus. Cardiovascular: Normal rate, regular rhythm.  Pulmonary/chest: Effort normal and breath sounds normal. No wheezing, rales or rhonchi. Abdominal: Soft, nondistended, nontender. Bowel sounds active throughout. There are no masses palpable. No hepatomegaly. Extremities: no edema Neurological: Alert and oriented to person place and  time. Skin: Skin is warm and dry. No rashes noted. Psychiatric: Normal mood and affect. Behavior is normal.  CBC    Component Value Date/Time   WBC 5.2 09/24/2020 1427   RBC 4.77 09/24/2020 1427   HGB 14.7 09/24/2020 1427   HCT 43.3 09/24/2020 1427   PLT 291.0 09/24/2020 1427   MCV 90.8 09/24/2020 1427   MCV 95.8 04/08/2012  1343   MCH 31.6 02/09/2015 0837   MCHC 33.9 09/24/2020 1427   RDW 12.9 09/24/2020 1427   LYMPHSABS 1.9 11/04/2015 1658   MONOABS 0.6 11/04/2015 1658   EOSABS 0.1 11/04/2015 1658   BASOSABS 0.0 11/04/2015 1658    CMP     Component Value Date/Time   NA 138 09/24/2020 1427   K 4.2 09/24/2020 1427   CL 102 09/24/2020 1427   CO2 23 09/24/2020 1427   GLUCOSE 100 (H) 09/24/2020 1427   BUN 15 09/24/2020 1427   CREATININE 0.72 09/24/2020 1427   CREATININE 0.72 10/20/2015 0816   CALCIUM 9.8 09/24/2020 1427   PROT 7.6 09/24/2020 1427   ALBUMIN 4.6 09/24/2020 1427   AST 18 09/24/2020 1427   ALT 15 09/24/2020 1427   ALKPHOS 75 09/24/2020 1427   BILITOT 0.7 09/24/2020 1427   GFRNONAA >90 03/07/2012 0440   GFRAA >90 03/07/2012 0440     ASSESSMENT AND PLAN: 70 year old female with history of small serrated polyp in the hepatic flexure on colonoscopy in 2017 without a known history of advanced polyps on her index colonoscopy in 2012.  Although the pathology report indicated the polyp was a hyperplastic polyp, given its location in the hepatic flexure, it may be more likely that it was a sessile serrated adenoma. Previous guidelines had suggested surveillance colonoscopies every 5 years in this scenario.  However, updated guidelines now recommend surveillance colonoscopies every 7 to 10 years for patients with 1-2 small adenomas.  I discussed this change in guidance with the patient, and offered to proceed with a colonoscopy based on the old guidelines and her previous gastroenterologist recommendations versus adhering to the newer guidelines and proceeding with a colonoscopy every 7 to 10 years.  The patient was appreciative of this, and did not want to undergo a colonoscopy unless absolutely necessary.  I told her I thought it would be very reasonable to wait 7 years, which would be June 2024.  The patient was agreeable to this.  History of colon polyps - Repeat colonoscopy in June 2024  Stefani Baik  E. Tomasa Rand, MD [facility]   Copland, Gwenlyn Found, MD

## 2021-02-03 ENCOUNTER — Other Ambulatory Visit: Payer: Self-pay | Admitting: Family Medicine

## 2021-02-03 DIAGNOSIS — E785 Hyperlipidemia, unspecified: Secondary | ICD-10-CM

## 2021-02-03 DIAGNOSIS — I10 Essential (primary) hypertension: Secondary | ICD-10-CM

## 2021-02-05 ENCOUNTER — Other Ambulatory Visit: Payer: Self-pay | Admitting: Family Medicine

## 2021-02-05 DIAGNOSIS — I10 Essential (primary) hypertension: Secondary | ICD-10-CM

## 2021-02-08 ENCOUNTER — Encounter: Payer: Self-pay | Admitting: Family Medicine

## 2021-02-08 MED ORDER — METHOCARBAMOL 500 MG PO TABS: 500.0000 mg | ORAL_TABLET | Freq: Three times a day (TID) | ORAL | 0 refills | Status: DC | PRN

## 2021-02-08 NOTE — Addendum Note (Signed)
Addended by: Pearline Cables on: 02/08/2021 06:43 PM   Modules accepted: Orders

## 2021-02-11 DIAGNOSIS — Z7983 Long term (current) use of bisphosphonates: Secondary | ICD-10-CM | POA: Diagnosis not present

## 2021-02-11 DIAGNOSIS — M1990 Unspecified osteoarthritis, unspecified site: Secondary | ICD-10-CM | POA: Diagnosis not present

## 2021-02-11 DIAGNOSIS — M792 Neuralgia and neuritis, unspecified: Secondary | ICD-10-CM | POA: Diagnosis not present

## 2021-02-11 DIAGNOSIS — I10 Essential (primary) hypertension: Secondary | ICD-10-CM | POA: Diagnosis not present

## 2021-02-11 DIAGNOSIS — Z791 Long term (current) use of non-steroidal anti-inflammatories (NSAID): Secondary | ICD-10-CM | POA: Diagnosis not present

## 2021-02-11 DIAGNOSIS — E785 Hyperlipidemia, unspecified: Secondary | ICD-10-CM | POA: Diagnosis not present

## 2021-02-11 DIAGNOSIS — Z8249 Family history of ischemic heart disease and other diseases of the circulatory system: Secondary | ICD-10-CM | POA: Diagnosis not present

## 2021-02-11 DIAGNOSIS — M810 Age-related osteoporosis without current pathological fracture: Secondary | ICD-10-CM | POA: Diagnosis not present

## 2021-02-13 NOTE — Progress Notes (Addendum)
[facility] [address] [place], [place] [address] [phone]  Date:  02/15/2021   Name:  Victoria Hogan   DOB:  [birth year]   MRN:  [id number]  PCP:  Pearline Cables, MD    Chief Complaint: back and leg pain (X 2 weeks. Pain travels down the left leg. Taking Ibu and the Robaxin 500 mg. )   History of Present Illness:  Victoria Hogan is a 70 y.o. very pleasant female patient who presents with the following:  Pt seen today with sciatica pain- -history of hypertension and diverticulitis, vitamin D deficiency, osteoporosis, hyperlipidemia, depression  Last visit with myself was in May  She contacted me recently with concern of back pain: on 10/3 The pain is in my lower back and kind of lodges in my buttock and sometimes my thigh. Not so much my legs or feet nor have I noticed numbness. No change in bowels or bladder. It comes and sometimes goes and I walk okay but I never know what move actually brings on the pain and usually I have to hold on to something to regain my balance.  I called in robaxin for her at that time - this has helped some but ibuprofen helps just as much She is just taking the robaxin at bedtime She does feel like her pain is improved - she is 50- 60% improved now She is the worst in the early am  The muscle spasm is not lasting as long  She is getting muscle spasm in the left buttock  It may run down to the level of her mid thigh No numbness No bowel or bladder control changes She was pulling a wagon at an outdoor shopping event recently and this may be what set off her sx  Never had this in the past   Flu vaccine - done already  Patient Active Problem List   Diagnosis Date Noted   Osteoporosis 08/30/2016   Overweight 08/22/2016   Hypertension 04/08/2012   Diverticulitis large intestine 03/23/2012   Vitamin D deficiency 03/14/2012   Heart murmur 03/06/2012    Past Medical History:   Diagnosis Date   Anxiety    GERD (gastroesophageal reflux disease)    Hemorrhoids    History of colonic diverticulitis    Hypertension    Pneumonia 02/2012    Past Surgical History:  Procedure Laterality Date   ABDOMINAL HYSTERECTOMY     bunyonectomy      Social History   Tobacco Use   Smoking status: Never   Smokeless tobacco: Never  Vaping Use   Vaping Use: Never used  Substance Use Topics   Alcohol use: Yes    Comment: occasionally   Drug use: Never    Family History  Problem Relation Age of Onset   Heart failure Father    Arthritis Mother     No Known Allergies  Medication list has been reviewed and updated.  Current Outpatient Medications on File Prior to Visit  Medication Sig Dispense Refill   alendronate (FOSAMAX) 70 MG tablet TAKE 1 TABLET (70 MG TOTAL) BY MOUTH EVERY 7 (SEVEN) DAYS. TAKE WITH A FULL GLASS OF WATER ON AN EMPTY STOMACH. 12 tablet 3   lisinopril-hydrochlorothiazide (ZESTORETIC) 10-12.5 MG tablet TAKE 1 TABLET BY MOUTH EVERY DAY 90 tablet 0   lovastatin (MEVACOR) 20 MG tablet TAKE 1 TABLET BY MOUTH EVERYDAY AT BEDTIME 90 tablet 0   methocarbamol (ROBAXIN)  500 MG tablet Take 1 tablet (500 mg total) by mouth every 8 (eight) hours as needed for muscle spasms. 30 tablet 0   No current facility-administered medications on file prior to visit.    Review of Systems:  As per HPI- otherwise negative.   Physical Examination: Vitals:   02/15/21 1334  BP: 132/74  Pulse: 90  Resp: 18  Temp: 98.2 F (36.8 C)  SpO2: 99%   Vitals:   02/15/21 1334  Weight: 149 lb 12.8 oz (67.9 kg)  Height: 5' (1.524 m)   Body mass index is 29.26 kg/m. Ideal Body Weight: Weight in (lb) to have BMI = 25: 127.7  GEN: no acute distress.  Mild overweight, looks well HEENT: Atraumatic, Normocephalic.  Ears and Nose: No external deformity. CV: RRR, No M/G/R. No JVD. No thrill. No extra heart sounds. PULM: CTA B, no wheezes, crackles, rhonchi. No retractions.  No resp. distress. No accessory muscle use. EXTR: No c/c/e PSYCH: Normally interactive. Conversant.  Normal bilateral lower extremity strength, sensation, DTR.  Negative straight leg raise bilaterally.  Normal thoracolumbar flexion extension She is tender to palpation at the left sciatic notch, and also the left superior hamstring insertion   Assessment and Plan: Sciatica, left side - Plan: predniSONE (DELTASONE) 20 MG tablet  Vitamin D deficiency - Plan: VITAMIN D 25 Hydroxy (Vit-D Deficiency, Fractures)  Screening for diabetes mellitus - Plan: Hemoglobin A1c  Essential hypertension - Plan: Basic metabolic panel  Adjustment disorder with depressed mood - Plan: FLUoxetine (PROZAC) 20 MG capsule  Patient seen today with concern of sciatica.  Some improvement with Robaxin and ibuprofen.  However, as her symptoms have continued we will add prednisone.  She will let me know if this is not helpful  Routine labs as above  Patient tends to have seasonal affective disorder symptoms this time of year, the holidays also some previous deaths in the family which is difficult for her.  She plans to start back on fluoxetine at 20 mg, can increase to 40 as needed Signed Abbe Amsterdam, MD   Addendum 10/11, received her labs as below.  Message to patient  Results for orders placed or performed in visit on 02/15/21  Basic metabolic panel  Result Value Ref Range   Sodium 136 135 - 145 mEq/L   Potassium 3.7 3.5 - 5.1 mEq/L   Chloride 100 96 - 112 mEq/L   CO2 27 19 - 32 mEq/L   Glucose, Bld 87 70 - 99 mg/dL   BUN 15 6 - 23 mg/dL   Creatinine, Ser 1.01 0.40 - 1.20 mg/dL   GFR 75.10 >25.85 mL/min   Calcium 9.5 8.4 - 10.5 mg/dL  VITAMIN D 25 Hydroxy (Vit-D Deficiency, Fractures)  Result Value Ref Range   VITD 39.23 30.00 - 100.00 ng/mL  Hemoglobin A1c  Result Value Ref Range   Hgb A1c MFr Bld 5.8 4.6 - 6.5 %

## 2021-02-15 ENCOUNTER — Ambulatory Visit: Payer: Medicare PPO | Admitting: Family Medicine

## 2021-02-15 ENCOUNTER — Other Ambulatory Visit: Payer: Self-pay

## 2021-02-15 VITALS — BP 132/74 | HR 90 | Temp 98.2°F | Resp 18 | Ht 60.0 in | Wt 149.8 lb

## 2021-02-15 DIAGNOSIS — I10 Essential (primary) hypertension: Secondary | ICD-10-CM | POA: Diagnosis not present

## 2021-02-15 DIAGNOSIS — F4321 Adjustment disorder with depressed mood: Secondary | ICD-10-CM | POA: Diagnosis not present

## 2021-02-15 DIAGNOSIS — M5432 Sciatica, left side: Secondary | ICD-10-CM

## 2021-02-15 DIAGNOSIS — Z131 Encounter for screening for diabetes mellitus: Secondary | ICD-10-CM | POA: Diagnosis not present

## 2021-02-15 DIAGNOSIS — R7303 Prediabetes: Secondary | ICD-10-CM | POA: Diagnosis not present

## 2021-02-15 DIAGNOSIS — E559 Vitamin D deficiency, unspecified: Secondary | ICD-10-CM | POA: Diagnosis not present

## 2021-02-15 MED ORDER — FLUOXETINE HCL 20 MG PO CAPS: 20.0000 mg | ORAL_CAPSULE | Freq: Every day | ORAL | 5 refills | Status: DC

## 2021-02-15 MED ORDER — PREDNISONE 20 MG PO TABS: ORAL_TABLET | ORAL | 0 refills | Status: DC

## 2021-02-15 NOTE — Patient Instructions (Addendum)
Good to see you, I am glad to see you doing better Add the prednisone for 6 days- take as directed Ok to continue robaxin as needed I will be in touch with your labs asap  Ok to start on fluoxetine as the winter approaches - start with 20 mg, may increase to 40 mg after 2-3 weeks

## 2021-02-16 ENCOUNTER — Encounter: Payer: Self-pay | Admitting: Family Medicine

## 2021-02-16 DIAGNOSIS — R7303 Prediabetes: Secondary | ICD-10-CM | POA: Insufficient documentation

## 2021-02-16 LAB — BASIC METABOLIC PANEL
BUN: 15 mg/dL (ref 6–23)
CO2: 27 mEq/L (ref 19–32)
Calcium: 9.5 mg/dL (ref 8.4–10.5)
Chloride: 100 mEq/L (ref 96–112)
Creatinine, Ser: 0.77 mg/dL (ref 0.40–1.20)
GFR: 78.14 mL/min (ref >60.00)
Glucose, Bld: 87 mg/dL (ref 70–99)
Potassium: 3.7 mEq/L (ref 3.5–5.1)
Sodium: 136 mEq/L (ref 135–145)

## 2021-02-16 LAB — VITAMIN D 25 HYDROXY (VIT D DEFICIENCY, FRACTURES): VITD: 39.23 ng/mL (ref 30.00–100.00)

## 2021-02-16 LAB — HEMOGLOBIN A1C: Hgb A1c MFr Bld: 5.8 % (ref 4.6–6.5)

## 2021-02-22 ENCOUNTER — Encounter: Payer: Self-pay | Admitting: Family Medicine

## 2021-02-23 MED ORDER — TRAMADOL HCL 50 MG PO TABS: 50.0000 mg | ORAL_TABLET | Freq: Three times a day (TID) | ORAL | 0 refills | Status: DC | PRN

## 2021-02-23 NOTE — Addendum Note (Signed)
Addended by: Abbe Amsterdam C on: 02/23/2021 06:32 AM   Modules accepted: Orders

## 2021-03-01 ENCOUNTER — Encounter: Payer: Self-pay | Admitting: Family Medicine

## 2021-03-04 ENCOUNTER — Encounter: Payer: Self-pay | Admitting: Family Medicine

## 2021-03-04 DIAGNOSIS — M5432 Sciatica, left side: Secondary | ICD-10-CM

## 2021-03-09 ENCOUNTER — Other Ambulatory Visit: Payer: Self-pay | Admitting: Family Medicine

## 2021-03-09 DIAGNOSIS — F4321 Adjustment disorder with depressed mood: Secondary | ICD-10-CM

## 2021-03-10 ENCOUNTER — Encounter: Payer: Self-pay | Admitting: Family Medicine

## 2021-03-11 ENCOUNTER — Other Ambulatory Visit: Payer: Self-pay | Admitting: Family Medicine

## 2021-03-11 DIAGNOSIS — F4321 Adjustment disorder with depressed mood: Secondary | ICD-10-CM

## 2021-05-05 ENCOUNTER — Other Ambulatory Visit: Payer: Self-pay | Admitting: Family Medicine

## 2021-05-05 DIAGNOSIS — I10 Essential (primary) hypertension: Secondary | ICD-10-CM

## 2021-05-05 DIAGNOSIS — E785 Hyperlipidemia, unspecified: Secondary | ICD-10-CM

## 2021-06-30 ENCOUNTER — Ambulatory Visit: Payer: Medicare PPO | Admitting: Family Medicine

## 2021-06-30 VITALS — BP 142/80 | HR 81 | Temp 97.5°F | Resp 18 | Ht 60.0 in | Wt 155.4 lb

## 2021-06-30 DIAGNOSIS — R052 Subacute cough: Secondary | ICD-10-CM | POA: Diagnosis not present

## 2021-06-30 DIAGNOSIS — R0982 Postnasal drip: Secondary | ICD-10-CM | POA: Diagnosis not present

## 2021-06-30 DIAGNOSIS — F4321 Adjustment disorder with depressed mood: Secondary | ICD-10-CM

## 2021-06-30 MED ORDER — IPRATROPIUM BROMIDE 0.03 % NA SOLN: 2.0000 | Freq: Two times a day (BID) | NASAL | 12 refills | Status: DC

## 2021-06-30 MED ORDER — CHERATUSSIN AC 100-10 MG/5ML PO SOLN: 5.0000 mL | Freq: Three times a day (TID) | ORAL | 0 refills | Status: DC | PRN

## 2021-06-30 NOTE — Patient Instructions (Signed)
Good to see you today!  Please let me know if the cough is not getting better Try- non sedating antihistamine such as claritin or zyrtec Nasal spray as needed to control post nasal drainage Cough syrup- this will make you a bit sleepy, do not use when you need to drive  Let me know if cough is not resolved in 7- 10 days; Sooner if worse.

## 2021-06-30 NOTE — Progress Notes (Signed)
[facility] at [facility] [address] [place], [place] [address] [phone]  Date:  06/30/2021   Name:  Victoria Hogan   DOB:  03/28/1951   MRN:  [id number]  PCP:  Pearline Cables, MD    Chief Complaint: persistant cough (Since about June. She has had some improvement. She has had some interruption of sleep due to the cough. Trying tea/honey and cough drops. )   History of Present Illness:  Victoria Hogan is a 71 y.o. very pleasant female patient who presents with the following:  Pt seen today with a cough- she has noted a cough for about 3 weeks It will start to resolve but they come back She feels like a tickle in her throat and drainage She is not congested, no stuffy nose No fever, she otherwise feels ok   She has tried OTC meds that she had on hand She also tried some vicks topically  Lemon tea with honey   Never been a smoker She has not noted any wheezing  Her cough tends to be worse at night- it is waking her up on a frequent basis No SOB  She is taking fluoxetine 20 mg and notes it is working great for her- she does not feel like she needs to go up to 40 mg at this time   Patient Active Problem List   Diagnosis Date Noted   Prediabetes 02/16/2021   Osteoporosis 08/30/2016   Overweight 08/22/2016   Hypertension 04/08/2012   Diverticulitis large intestine 03/23/2012   Vitamin D deficiency 03/14/2012   Heart murmur 03/06/2012    Past Medical History:  Diagnosis Date   Anxiety    GERD (gastroesophageal reflux disease)    Hemorrhoids    History of colonic diverticulitis    Hypertension    Pneumonia 02/2012    Past Surgical History:  Procedure Laterality Date   ABDOMINAL HYSTERECTOMY     bunyonectomy      Social History   Tobacco Use   Smoking status: Never   Smokeless tobacco: Never  Vaping Use   Vaping Use: Never used  Substance Use Topics   Alcohol use: Yes    Comment: occasionally    Drug use: Never    Family History  Problem Relation Age of Onset   Heart failure Father    Arthritis Mother     No Known Allergies  Medication list has been reviewed and updated.  Current Outpatient Medications on File Prior to Visit  Medication Sig Dispense Refill   alendronate (FOSAMAX) 70 MG tablet TAKE 1 TABLET (70 MG TOTAL) BY MOUTH EVERY 7 (SEVEN) DAYS. TAKE WITH A FULL GLASS OF WATER ON AN EMPTY STOMACH. 12 tablet 3   FLUoxetine (PROZAC) 20 MG capsule TAKE 1 CAPSULE BY MOUTH DAILY *MAY INCREASE TO 2 CAPSULES DAILY AFTER 2-3 WEEKS* 180 capsule 2   lisinopril-hydrochlorothiazide (ZESTORETIC) 10-12.5 MG tablet TAKE 1 TABLET BY MOUTH EVERY DAY 90 tablet 1   lovastatin (MEVACOR) 20 MG tablet TAKE 1 TABLET BY MOUTH EVERYDAY AT BEDTIME 90 tablet 1   No current facility-administered medications on file prior to visit.    Review of Systems:  As per HPI- otherwise negative.   Physical Examination: Vitals:   06/30/21 0853  BP: (!) 142/80  Pulse: 81  Resp: 18  Temp: (!) 97.5 F (36.4 C)  SpO2: 99%   Vitals:   06/30/21 0853  Weight: 155 lb 6.4 oz (70.5  kg)  Height: 5' (1.524 m)   Body mass index is 30.35 kg/m. Ideal Body Weight: Weight in (lb) to have BMI = 25: 127.7  GEN: no acute distress. Mild obesity, looks well  HEENT: Atraumatic, Normocephalic.  Bilateral TM wnl, oropharynx normal.  PEERL,EOMI.   Ears and Nose: No external deformity. CV: RRR, No M/G/R. No JVD. No thrill. No extra heart sounds. PULM: CTA B, no wheezes, crackles, rhonchi. No retractions. No resp. distress. No accessory muscle use. EXTR: No c/c/e PSYCH: Normally interactive. Conversant.    Assessment and Plan: Subacute cough - Plan: guaiFENesin-codeine (CHERATUSSIN AC) 100-10 MG/5ML syrup  PND (post-nasal drip) - Plan: ipratropium (ATROVENT) 0.03 % nasal spray  Adjustment disorder with depressed mood Pt seen today with a cough- she otherwise feels well May be allergies- early spring blooms-  vs PND Cheratussin- cautioned regarding sedation Atrovent nasal prn- no history of glaucoma Continue prozac- working great for her Let me know if not cough is not better in a week- Sooner if worse.     Signed Abbe Amsterdam, MD

## 2021-08-03 ENCOUNTER — Encounter: Payer: Self-pay | Admitting: Family Medicine

## 2021-10-18 ENCOUNTER — Other Ambulatory Visit: Payer: Self-pay | Admitting: Family Medicine

## 2021-10-31 NOTE — Patient Instructions (Addendum)
It was good to see you today, I will be in touch with your labs asap Assuming all is well please see me in about 6 months I ordered a CT coronary calcium score-  you can stop by imaging to schedule this today  Pneumonia vaccine given today

## 2021-10-31 NOTE — Progress Notes (Signed)
[facility] at [facility] [address] [place], [place] [address] [phone]  Date:  11/03/2021   Name:  Victoria Hogan   DOB:  08/08/1950   MRN:  [id number]  PCP:  Pearline Cables, MD    Chief Complaint: Annual Exam   History of Present Illness:  Victoria Hogan is a 71 y.o. very pleasant female patient who presents with the following:  Patient seen today for physical exam Most recent visit with myself was in February for cough- this has cleared up mostly  History of prediabetes, osteoporosis, hypertension, diverticulitis, vitamin D deficiency  Status post hysterectomy Due for colon cancer screening- pt notes she is a pt at [facility] and she was told not time to update yet  Can give second dose of Pneumovax- will do today  Update labs today; she is fasting today  Mammogram due in August- this is ordered Can also order bone density update- ordered   Fosamax Lisinopril/HCTZ Fluoxetine- working well for her. She is taking just 20 mg  Lovastatin  She is trying to exercise- she enjoys going to the gym due to the hot weather outdoors No CP or SOB with exercise  Patient Active Problem List   Diagnosis Date Noted   Prediabetes 02/16/2021   Osteoporosis 08/30/2016   Overweight 08/22/2016   Hypertension 04/08/2012   Diverticulitis large intestine 03/23/2012   Vitamin D deficiency 03/14/2012   Heart murmur 03/06/2012    Past Medical History:  Diagnosis Date   Anxiety    GERD (gastroesophageal reflux disease)    Heart murmur    Hemorrhoids    History of colonic diverticulitis    Hypertension    Pneumonia 02/2012    Past Surgical History:  Procedure Laterality Date   ABDOMINAL HYSTERECTOMY     bunyonectomy      Social History   Tobacco Use   Smoking status: Never   Smokeless tobacco: Never  Vaping Use   Vaping Use: Never used  Substance Use Topics   Alcohol use: Yes    Alcohol/week: 4.0 standard drinks of  alcohol    Types: 4 Glasses of wine per week    Comment: Sometimes more sometimes less   Drug use: Never    Family History  Problem Relation Age of Onset   Heart failure Father    Arthritis Mother     No Known Allergies  Medication list has been reviewed and updated.  Current Outpatient Medications on File Prior to Visit  Medication Sig Dispense Refill   alendronate (FOSAMAX) 70 MG tablet TAKE 1 TABLET BY MOUTH WEEKLY, TAKE WITH A FULL GLASS OF WATER ON EMTPY STOMACH 12 tablet 3   FLUoxetine (PROZAC) 20 MG capsule TAKE 1 CAPSULE BY MOUTH DAILY *MAY INCREASE TO 2 CAPSULES DAILY AFTER 2-3 WEEKS* 180 capsule 2   guaiFENesin-codeine (CHERATUSSIN AC) 100-10 MG/5ML syrup Take 5 mLs by mouth 3 (three) times daily as needed for cough. (Patient not taking: Reported on 11/03/2021) 60 mL 0   ipratropium (ATROVENT) 0.03 % nasal spray Place 2 sprays into both nostrils every 12 (twelve) hours. (Patient not taking: Reported on 11/03/2021) 30 mL 12   No current facility-administered medications on file prior to visit.    Review of Systems:  As per HPI- otherwise negative.   Physical Examination: Vitals:   11/03/21 0918  BP: 132/90  Pulse: 76  Resp: 20  SpO2: 99%   Vitals:   11/03/21 2202  Weight: 149 lb 6.4 oz (67.8 kg)  Height: 5' (1.524 m)   Body mass index is 29.18 kg/m. Ideal Body Weight: Weight in (lb) to have BMI = 25: 127.7  GEN: no acute distress. Mild overweight, looks well  HEENT: Atraumatic, Normocephalic.  Ears and Nose: No external deformity. CV: RRR, No M/G/R. No JVD. No thrill. No extra heart sounds. PULM: CTA B, no wheezes, crackles, rhonchi. No retractions. No resp. distress. No accessory muscle use. ABD: S, NT, ND, +BS. No rebound. No HSM. EXTR: No c/c/e PSYCH: Normally interactive. Conversant.    Assessment and Plan: Physical exam  Prediabetes - Plan: Comprehensive metabolic panel, Hemoglobin A1c, CT CARDIAC SCORING (SELF PAY ONLY)  Essential  hypertension - Plan: CBC, Comprehensive metabolic panel, lisinopril-hydrochlorothiazide (ZESTORETIC) 10-12.5 MG tablet, CT CARDIAC SCORING (SELF PAY ONLY)  Dyslipidemia - Plan: Lipid panel, lovastatin (MEVACOR) 20 MG tablet, CT CARDIAC SCORING (SELF PAY ONLY)  Screening for deficiency anemia - Plan: CBC  Screening for thyroid disorder - Plan: TSH  Fatigue, unspecified type - Plan: TSH  Immunization due - Plan: Pneumococcal polysaccharide vaccine 23-valent greater than or equal to 2yo subcutaneous/IM  Physical exam today.  Encouraged healthy diet and exercise routine Will plan further follow- up pending labs. Updated pneumonia vaccine Blood pressure under good control, continue current medication Given risk factors she is also interested in pursuing CT coronary calcium Signed Abbe Amsterdam, MD  Received patient labs as below, sent message  Results for orders placed or performed in visit on 11/03/21  CBC  Result Value Ref Range   WBC 4.4 4.0 - 10.5 K/uL   RBC 4.19 3.87 - 5.11 Mil/uL   Platelets 279.0 150.0 - 400.0 K/uL   Hemoglobin 13.1 12.0 - 15.0 g/dL   HCT 11.9 41.7 - 40.8 %   MCV 93.4 78.0 - 100.0 fl   MCHC 33.6 30.0 - 36.0 g/dL   RDW 14.4 81.8 - 56.3 %  Comprehensive metabolic panel  Result Value Ref Range   Sodium 137 135 - 145 mEq/L   Potassium 4.8 3.5 - 5.1 mEq/L   Chloride 100 96 - 112 mEq/L   CO2 30 19 - 32 mEq/L   Glucose, Bld 109 (H) 70 - 99 mg/dL   BUN 11 6 - 23 mg/dL   Creatinine, Ser 1.49 0.40 - 1.20 mg/dL   Total Bilirubin 0.4 0.2 - 1.2 mg/dL   Alkaline Phosphatase 64 39 - 117 U/L   AST 30 0 - 37 U/L   ALT 32 0 - 35 U/L   Total Protein 6.9 6.0 - 8.3 g/dL   Albumin 4.3 3.5 - 5.2 g/dL   GFR 70.26 >37.85 mL/min   Calcium 9.3 8.4 - 10.5 mg/dL  Lipid panel  Result Value Ref Range   Cholesterol 178 0 - 200 mg/dL   Triglycerides 88.5 0.0 - 149.0 mg/dL   HDL 02.77 >41.28 mg/dL   VLDL 78.6 0.0 - 76.7 mg/dL   LDL Cholesterol 209 (H) 0 - 99 mg/dL   Total  CHOL/HDL Ratio 3    NonHDL 113.62   Hemoglobin A1c  Result Value Ref Range   Hgb A1c MFr Bld 5.6 4.6 - 6.5 %  TSH  Result Value Ref Range   TSH 1.54 0.35 - 5.50 uIU/mL

## 2021-11-02 ENCOUNTER — Ambulatory Visit (INDEPENDENT_AMBULATORY_CARE_PROVIDER_SITE_OTHER): Payer: Medicare PPO

## 2021-11-02 ENCOUNTER — Ambulatory Visit: Payer: Medicare PPO

## 2021-11-02 DIAGNOSIS — Z780 Asymptomatic menopausal state: Secondary | ICD-10-CM | POA: Diagnosis not present

## 2021-11-02 DIAGNOSIS — Z1231 Encounter for screening mammogram for malignant neoplasm of breast: Secondary | ICD-10-CM | POA: Diagnosis not present

## 2021-11-02 DIAGNOSIS — Z0000 Encounter for general adult medical examination without abnormal findings: Secondary | ICD-10-CM

## 2021-11-02 NOTE — Progress Notes (Addendum)
Subjective:   Victoria Hogan is a 71 y.o. female who presents for Medicare Annual (Subsequent) preventive examination.  I connected with  Victoria Hogan on 11/02/21 by a audio enabled telemedicine application and verified that I am speaking with the correct person using two identifiers.  Patient Location: Home  Provider Location: Office/Clinic  I discussed the limitations of evaluation and management by telemedicine. The patient expressed understanding and agreed to proceed.   Review of Systems     Cardiac Risk Factors include: advanced age (>65men, >50 women);hypertension     Objective:    There were no vitals filed for this visit. There is no height or weight on file to calculate BMI.     11/02/2021    9:47 AM 10/29/2020   11:14 AM 09/26/2019   11:58 AM 09/14/2018    1:19 PM 09/12/2017    3:12 PM 03/06/2012   12:53 PM  Advanced Directives  Does Patient Have a Medical Advance Directive? No No No No No Patient does not have advance directive  Does patient want to make changes to medical advance directive?  Yes (MAU/Ambulatory/Procedural Areas - Information given)      Would patient like information on creating a medical advance directive? No - Patient declined  No - Patient declined No - Patient declined Yes (MAU/Ambulatory/Procedural Areas - Information given)   Pre-existing out of facility DNR order (yellow form or pink MOST form)      No    Current Medications (verified) Outpatient Encounter Medications as of 11/02/2021  Medication Sig   alendronate (FOSAMAX) 70 MG tablet TAKE 1 TABLET BY MOUTH WEEKLY, TAKE WITH A FULL GLASS OF WATER ON EMTPY STOMACH   FLUoxetine (PROZAC) 20 MG capsule TAKE 1 CAPSULE BY MOUTH DAILY *MAY INCREASE TO 2 CAPSULES DAILY AFTER 2-3 WEEKS*   guaiFENesin-codeine (CHERATUSSIN AC) 100-10 MG/5ML syrup Take 5 mLs by mouth 3 (three) times daily as needed for cough.   ipratropium (ATROVENT) 0.03 % nasal spray Place 2 sprays into both nostrils every 12  (twelve) hours.   lisinopril-hydrochlorothiazide (ZESTORETIC) 10-12.5 MG tablet TAKE 1 TABLET BY MOUTH EVERY DAY   lovastatin (MEVACOR) 20 MG tablet TAKE 1 TABLET BY MOUTH EVERYDAY AT BEDTIME   No facility-administered encounter medications on file as of 11/02/2021.    Allergies (verified) Patient has no known allergies.   History: Past Medical History:  Diagnosis Date   Anxiety    GERD (gastroesophageal reflux disease)    Hemorrhoids    History of colonic diverticulitis    Hypertension    Pneumonia 02/2012   Past Surgical History:  Procedure Laterality Date   ABDOMINAL HYSTERECTOMY     bunyonectomy     Family History  Problem Relation Age of Onset   Heart failure Father    Arthritis Mother    Social History   Socioeconomic History   Marital status: Divorced    Spouse name: Not on file   Number of children: Not on file   Years of education: college   Highest education level: Not on file  Occupational History   Occupation: Magazine features editor: [employer]  Tobacco Use   Smoking status: Never   Smokeless tobacco: Never  Vaping Use   Vaping Use: Never used  Substance and Sexual Activity   Alcohol use: Yes    Comment: occasionally   Drug use: Never   Sexual activity: Not Currently  Other Topics Concern   Not on file  Social History Narrative  Bikes daily for exercise.   Social Determinants of Health   Financial Resource Strain: Low Risk  (10/29/2020)   Overall Financial Resource Strain (CARDIA)    Difficulty of Paying Living Expenses: Not hard at all  Food Insecurity: No Food Insecurity (10/29/2020)   Hunger Vital Sign    Worried About Running Out of Food in the Last Year: Never true    Ran Out of Food in the Last Year: Never true  Transportation Needs: No Transportation Needs (10/29/2020)   PRAPARE - Administrator, Civil Service (Medical): No    Lack of Transportation (Non-Medical): No  Physical Activity: Insufficiently Active  (10/29/2020)   Exercise Vital Sign    Days of Exercise per Week: 5 days    Minutes of Exercise per Session: 20 min  Stress: No Stress Concern Present (10/29/2020)   [employer] of Occupational Health - Occupational Stress Questionnaire    Feeling of Stress : Not at all  Social Connections: Moderately Isolated (10/29/2020)   Social Connection and Isolation Panel [NHANES]    Frequency of Communication with Friends and Family: More than three times a week    Frequency of Social Gatherings with Friends and Family: More than three times a week    Attends Religious Services: More than 4 times per year    Active Member of [employer] or Organizations: No    Attends Engineer, structural: Never    Marital Status: Divorced    Tobacco Counseling Counseling given: Not Answered   Clinical Intake:  Pre-visit preparation completed: Yes  Pain : No/denies pain     BMI - recorded: 30.35 Nutritional Risks: None Diabetes: No  How often do you need to have someone help you when you read instructions, pamphlets, or other written materials from your doctor or pharmacy?: 1 - Never  Diabetic?no  Interpreter Needed?: No  Information entered by :: Cammie Faulstich   Activities of Daily Living    11/02/2021    9:49 AM  In your present state of health, do you have any difficulty performing the following activities:  Hearing? 0  Vision? 0  Difficulty concentrating or making decisions? 0  Walking or climbing stairs? 0  Dressing or bathing? 0  Doing errands, shopping? 0  Preparing Food and eating ? N  Using the Toilet? N  In the past six months, have you accidently leaked urine? N  Do you have problems with loss of bowel control? N  Managing your Medications? N  Managing your Finances? N  Housekeeping or managing your Housekeeping? N    Patient Care Team: Copland, Gwenlyn Found, MD as PCP - General (Family Medicine) Jeani Hawking, MD as Attending Physician  (Gastroenterology)  Indicate any recent Medical Services you may have received from other than [facility] providers in the past year (date may be approximate).     Assessment:   This is a routine wellness examination for [name].  Hearing/Vision screen No results found.  Dietary issues and exercise activities discussed: Current Exercise Habits: Home exercise routine, Type of exercise: walking;treadmill, Time (Minutes): 30, Frequency (Times/Week): 7, Weekly Exercise (Minutes/Week): 210, Intensity: Mild, Exercise limited by: None identified   Goals Addressed   None    Depression Screen    06/30/2021    9:09 AM 10/29/2020   11:16 AM 09/26/2019   12:02 PM 09/14/2018    1:19 PM 09/12/2017    3:14 PM 10/22/2015   12:13 PM 10/20/2015    8:00 AM  PHQ 2/9 Scores  PHQ - 2 Score 0 0 0 0 0 0 0    Fall Risk    11/02/2021    9:47 AM 10/29/2020   11:15 AM 09/26/2019   12:01 PM 09/14/2018    1:19 PM 09/12/2017    3:14 PM  Fall Risk   Falls in the past year? 0 0 0 0 No  Number falls in past yr: 0 0 0    Injury with Fall? 0 0 0    Risk for fall due to : No Fall Risks      Follow up Falls evaluation completed Falls prevention discussed Education provided;Falls prevention discussed      FALL RISK PREVENTION PERTAINING TO THE HOME:  Any stairs in or around the home? No  If so, are there any without handrails?  N/A Home free of loose throw rugs in walkways, pet beds, electrical cords, etc? Yes  Adequate lighting in your home to reduce risk of falls? Yes   ASSISTIVE DEVICES UTILIZED TO PREVENT FALLS:  Life alert? No  Use of a cane, walker or w/c? No  Grab bars in the bathroom? Yes  Shower chair or bench in shower? No  Elevated toilet seat or a handicapped toilet? No   TIMED UP AND GO:  Was the test performed? No .    Cognitive Function:        11/02/2021    9:54 AM  6CIT Screen  What Year? 0 points  What month? 0 points  What time? 0 points  Count back from 20 0 points  Months in reverse  0 points  Repeat phrase 0 points  Total Score 0 points    Immunizations Immunization History  Administered Date(s) Administered   Fluad Quad(high Dose 65+) 01/12/2019, 03/18/2020   Influenza Split 03/07/2012   Influenza, High Dose Seasonal PF 02/01/2018   Influenza,inj,Quad PF,6+ Mos 04/10/2013, 02/21/2014, 02/09/2015   Influenza-Unspecified 02/03/2021   PFIZER Comirnaty(Gray Top)Covid-19 Tri-Sucrose Vaccine 10/12/2020   PFIZER(Purple Top)SARS-COV-2 Vaccination 06/23/2019, 07/07/2019, 04/15/2020   Pfizer Covid-19 Vaccine Bivalent Booster 42yrs & up 03/18/2021   Pneumococcal Conjugate-13 08/22/2016   Pneumococcal Polysaccharide-23 03/07/2012   Tdap 12/05/2012   Zoster Recombinat (Shingrix) 01/12/2019, 04/13/2019    TDAP status: Up to date  Flu Vaccine status: Up to date  Pneumococcal vaccine status: Up to date  Covid-19 vaccine status: Completed vaccines  Qualifies for Shingles Vaccine? Yes   Zostavax completed No   Shingrix Completed?: Yes  Screening Tests Health Maintenance  Topic Date Due   Pneumonia Vaccine 93+ Years old (3 - PPSV23 if available, else PCV20) 08/22/2017   COLONOSCOPY (Pts 45-46yrs Insurance coverage will need to be confirmed)  10/13/2020   COVID-19 Vaccine (6 - Pfizer series) 07/16/2021   INFLUENZA VACCINE  12/07/2021   TETANUS/TDAP  12/06/2022   MAMMOGRAM  12/08/2022   DEXA SCAN  Completed   Hepatitis C Screening  Completed   Zoster Vaccines- Shingrix  Completed   HPV VACCINES  Aged Out    Health Maintenance  Health Maintenance Due  Topic Date Due   Pneumonia Vaccine 19+ Years old (3 - PPSV23 if available, else PCV20) 08/22/2017   COLONOSCOPY (Pts 45-47yrs Insurance coverage will need to be confirmed)  10/13/2020   COVID-19 Vaccine (6 - Pfizer series) 07/16/2021    Colorectal cancer screening: Type of screening: Colonoscopy. Completed 10/13/15. Repeat every 10 years  Mammogram status: Completed 12/07/20. Repeat every year  Bone Density  status: Ordered 11/02/21. Pt provided with contact info and advised to call  to schedule appt.  Lung Cancer Screening: (Low Dose CT Chest recommended if Age 53-80 years, 30 pack-year currently smoking OR have quit w/in 15years.) does not qualify.   Lung Cancer Screening Referral: N/A  Additional Screening:  Hepatitis C Screening: does qualify; Completed 08/22/16  Vision Screening: Recommended annual ophthalmology exams for early detection of glaucoma and other disorders of the eye. Is the patient up to date with their annual eye exam?  Yes  Who is the provider or what is the name of the office in which the patient attends annual eye exams? Dr. Shea Evans If pt is not established with a provider, would they like to be referred to a provider to establish care? No .   Dental Screening: Recommended annual dental exams for proper oral hygiene  Community Resource Referral / Chronic Care Management: CRR required this visit?  No   CCM required this visit?  No      Plan:     I have personally reviewed and noted the following in the patient's chart:   Medical and social history Use of alcohol, tobacco or illicit drugs  Current medications and supplements including opioid prescriptions.  Functional ability and status Nutritional status Physical activity Advanced directives List of other physicians Hospitalizations, surgeries, and ER visits in previous 12 months Vitals Screenings to include cognitive, depression, and falls Referrals and appointments  In addition, I have reviewed and discussed with patient certain preventive protocols, quality metrics, and best practice recommendations. A written personalized care plan for preventive services as well as general preventive health recommendations were provided to patient.   Due to this being a telephonic visit, the after visit summary with patients personalized plan was offered to patient via mail or my-chart.  Patient would like to access on  my-chart   Illene Bolus, CMA   11/02/2021   Nurse Notes: mammogram and dexa

## 2021-11-03 ENCOUNTER — Encounter: Payer: Self-pay | Admitting: Family Medicine

## 2021-11-03 ENCOUNTER — Ambulatory Visit (INDEPENDENT_AMBULATORY_CARE_PROVIDER_SITE_OTHER): Payer: Medicare PPO | Admitting: Family Medicine

## 2021-11-03 ENCOUNTER — Ambulatory Visit: Payer: Medicare PPO

## 2021-11-03 VITALS — BP 132/90 | HR 76 | Resp 20 | Ht 60.0 in | Wt 149.4 lb

## 2021-11-03 DIAGNOSIS — R7303 Prediabetes: Secondary | ICD-10-CM

## 2021-11-03 DIAGNOSIS — Z0000 Encounter for general adult medical examination without abnormal findings: Secondary | ICD-10-CM

## 2021-11-03 DIAGNOSIS — I10 Essential (primary) hypertension: Secondary | ICD-10-CM

## 2021-11-03 DIAGNOSIS — Z23 Encounter for immunization: Secondary | ICD-10-CM | POA: Diagnosis not present

## 2021-11-03 DIAGNOSIS — E785 Hyperlipidemia, unspecified: Secondary | ICD-10-CM

## 2021-11-03 DIAGNOSIS — Z130 Encounter for screening for diseases of the blood and blood-forming organs and certain disorders involving the immune mechanism: Secondary | ICD-10-CM | POA: Diagnosis not present

## 2021-11-03 DIAGNOSIS — R5383 Other fatigue: Secondary | ICD-10-CM | POA: Diagnosis not present

## 2021-11-03 DIAGNOSIS — Z1329 Encounter for screening for other suspected endocrine disorder: Secondary | ICD-10-CM | POA: Diagnosis not present

## 2021-11-03 LAB — COMPREHENSIVE METABOLIC PANEL
ALT: 32 U/L (ref 0–35)
AST: 30 U/L (ref 0–37)
Albumin: 4.3 g/dL (ref 3.5–5.2)
Alkaline Phosphatase: 64 U/L (ref 39–117)
BUN: 11 mg/dL (ref 6–23)
CO2: 30 mEq/L (ref 19–32)
Calcium: 9.3 mg/dL (ref 8.4–10.5)
Chloride: 100 mEq/L (ref 96–112)
Creatinine, Ser: 0.71 mg/dL (ref 0.40–1.20)
GFR: 85.7 mL/min (ref >60.00)
Glucose, Bld: 109 mg/dL — ABNORMAL HIGH (ref 70–99)
Potassium: 4.8 mEq/L (ref 3.5–5.1)
Sodium: 137 mEq/L (ref 135–145)
Total Bilirubin: 0.4 mg/dL (ref 0.2–1.2)
Total Protein: 6.9 g/dL (ref 6.0–8.3)

## 2021-11-03 LAB — CBC
HCT: 39.1 % (ref 36.0–46.0)
Hemoglobin: 13.1 g/dL (ref 12.0–15.0)
MCHC: 33.6 g/dL (ref 30.0–36.0)
MCV: 93.4 fl (ref 78.0–100.0)
Platelets: 279 10*3/uL (ref 150.0–400.0)
RBC: 4.19 Mil/uL (ref 3.87–5.11)
RDW: 12.1 % (ref 11.5–15.5)
WBC: 4.4 10*3/uL (ref 4.0–10.5)

## 2021-11-03 LAB — LIPID PANEL
Cholesterol: 178 mg/dL (ref 0–200)
HDL: 64.7 mg/dL (ref >39.00)
LDL Cholesterol: 100 mg/dL — ABNORMAL HIGH (ref 0–99)
NonHDL: 113.62
Total CHOL/HDL Ratio: 3
Triglycerides: 68 mg/dL (ref 0.0–149.0)
VLDL: 13.6 mg/dL (ref 0.0–40.0)

## 2021-11-03 LAB — TSH: TSH: 1.54 u[IU]/mL (ref 0.35–5.50)

## 2021-11-03 LAB — HEMOGLOBIN A1C: Hgb A1c MFr Bld: 5.6 % (ref 4.6–6.5)

## 2021-11-03 MED ORDER — LOVASTATIN 20 MG PO TABS
ORAL_TABLET | ORAL | 3 refills | Status: DC
Start: 2021-11-03 — End: 2022-08-10

## 2021-11-03 MED ORDER — LISINOPRIL-HYDROCHLOROTHIAZIDE 10-12.5 MG PO TABS: 1.0000 | ORAL_TABLET | Freq: Every day | ORAL | 3 refills | Status: DC

## 2021-11-21 IMAGING — MG DIGITAL SCREENING BILAT W/ TOMO W/ CAD
8 series · 8 of 24 positions shown · non-contrast
Comparison: Previous exam(s).

CLINICAL DATA: Screening.

EXAM:
DIGITAL SCREENING BILATERAL MAMMOGRAM WITH TOMO AND CAD

[L CC synth-2D]
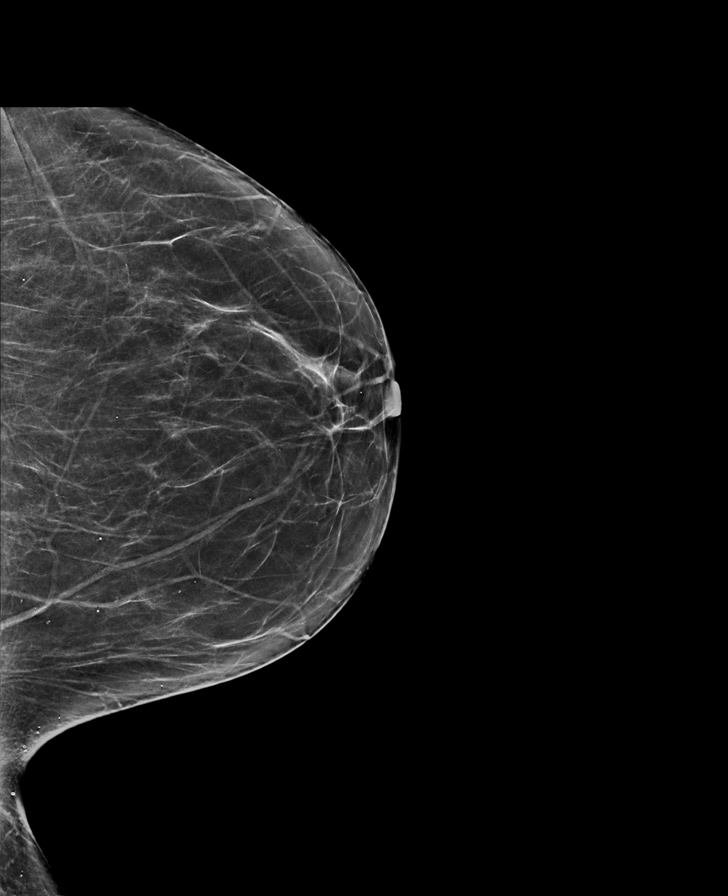

[L MLO synth-2D]
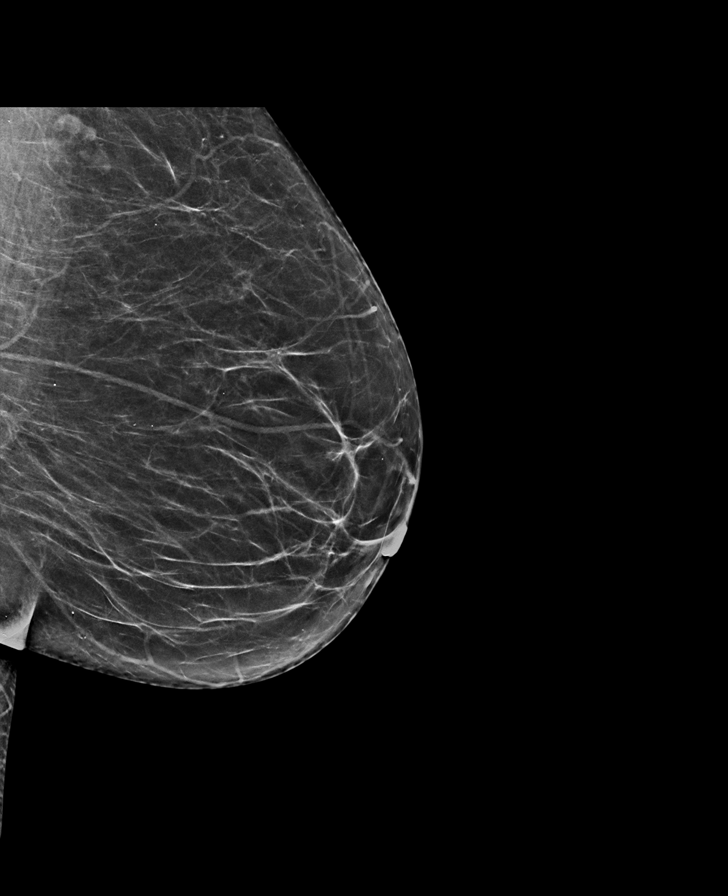

[R MLO synth-2D]
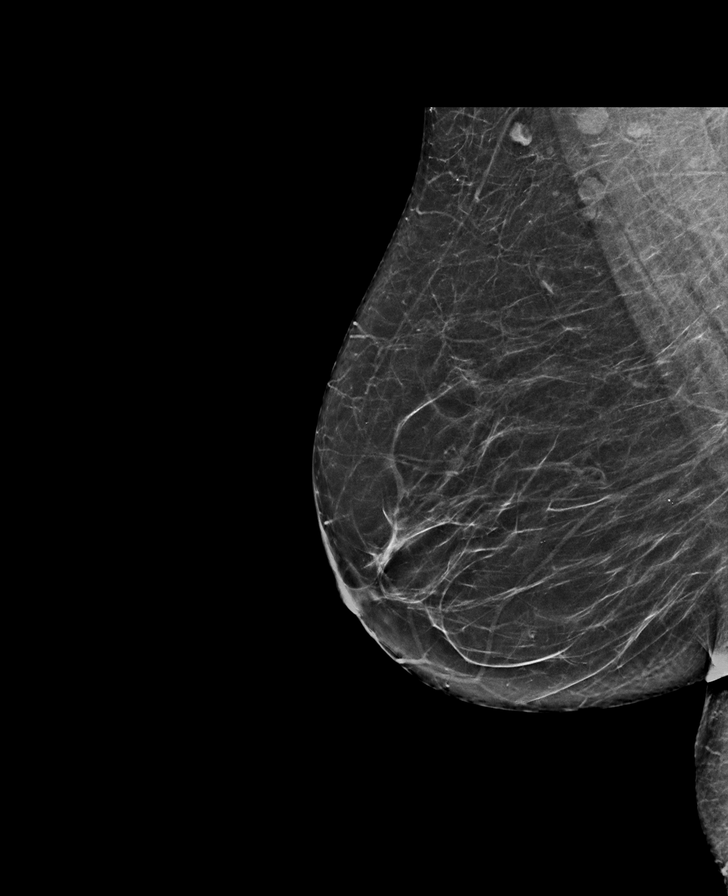

[R CC synth-2D]
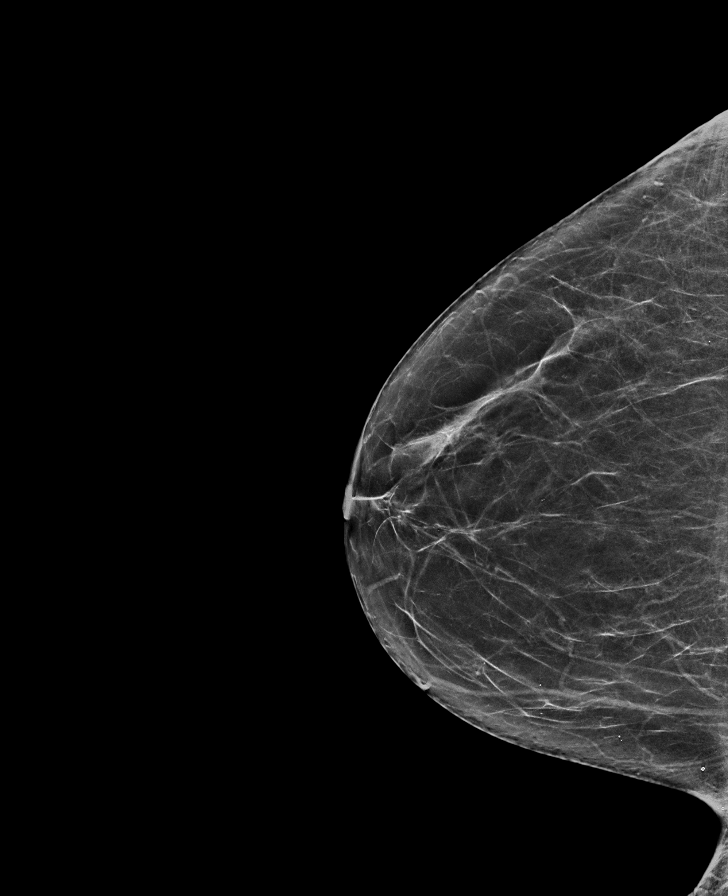

[R CC tomo · tomo slice 31/62.0]
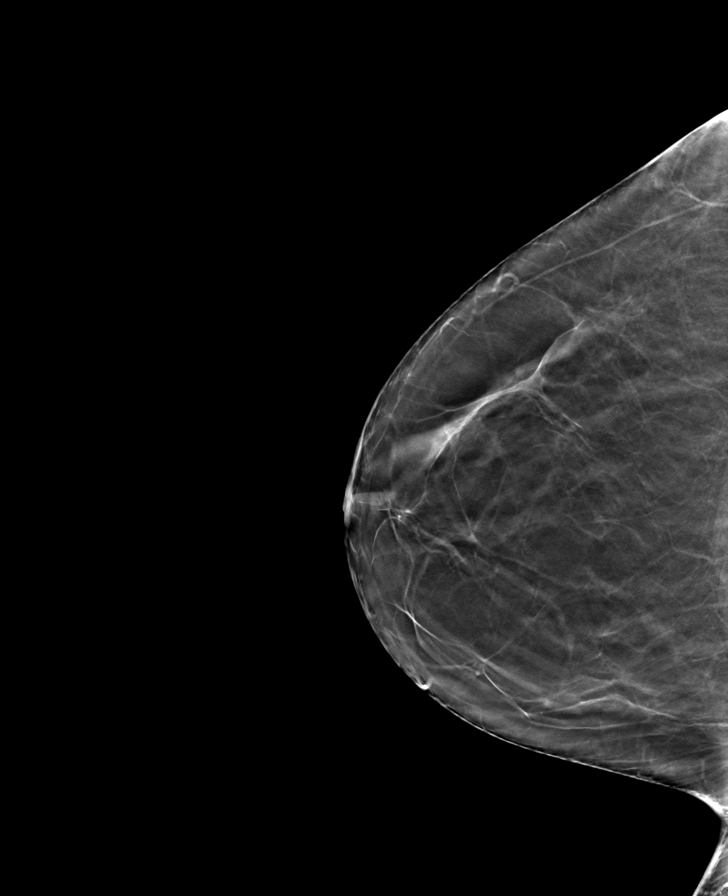

[R MLO tomo · tomo slice 31/62.0]
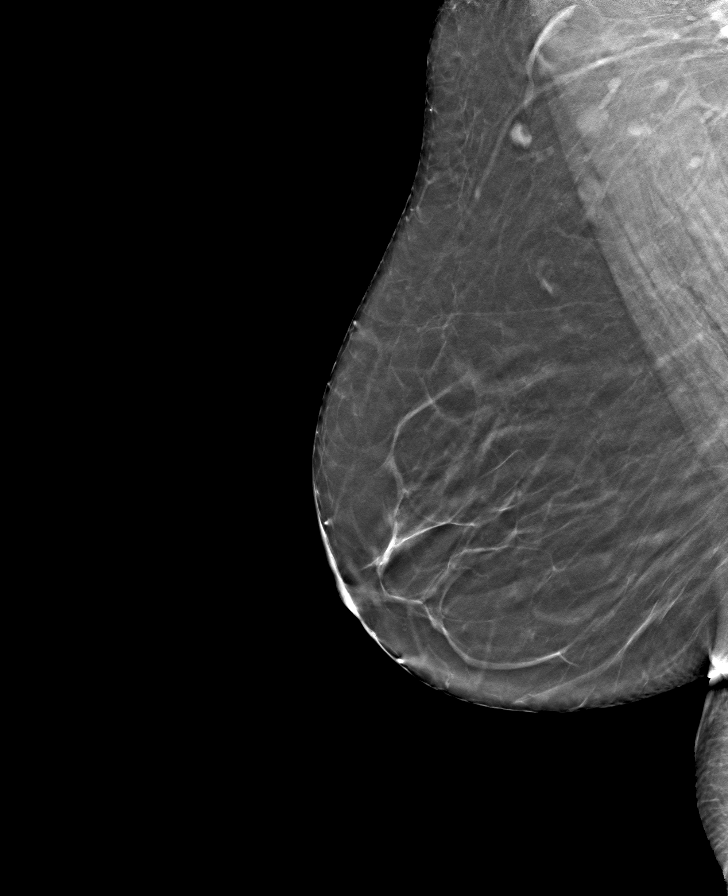

[L CC tomo · tomo slice 33/64.0]
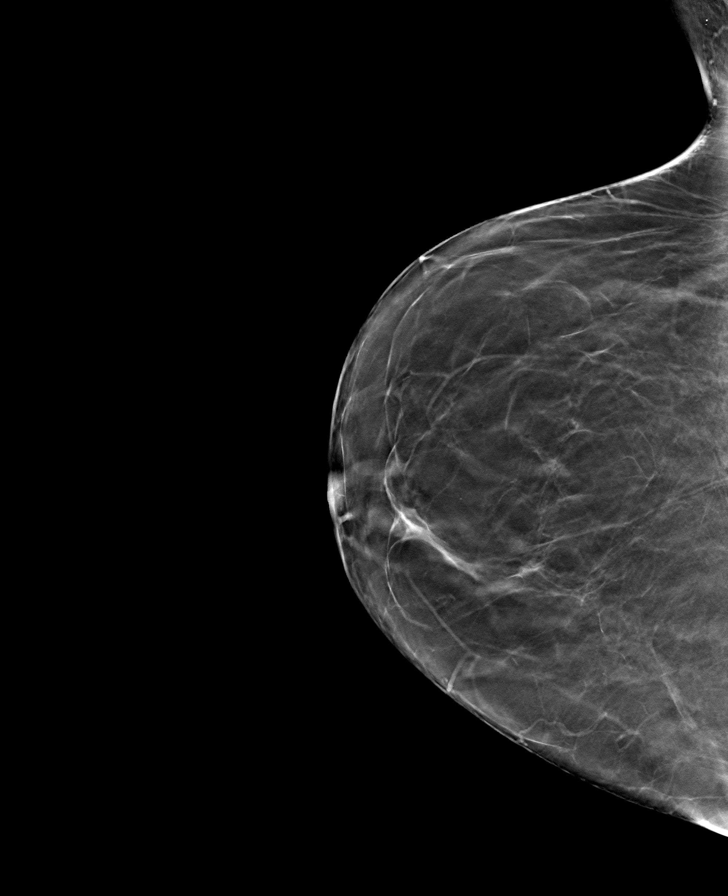

[L MLO tomo · tomo slice 33/64.0]
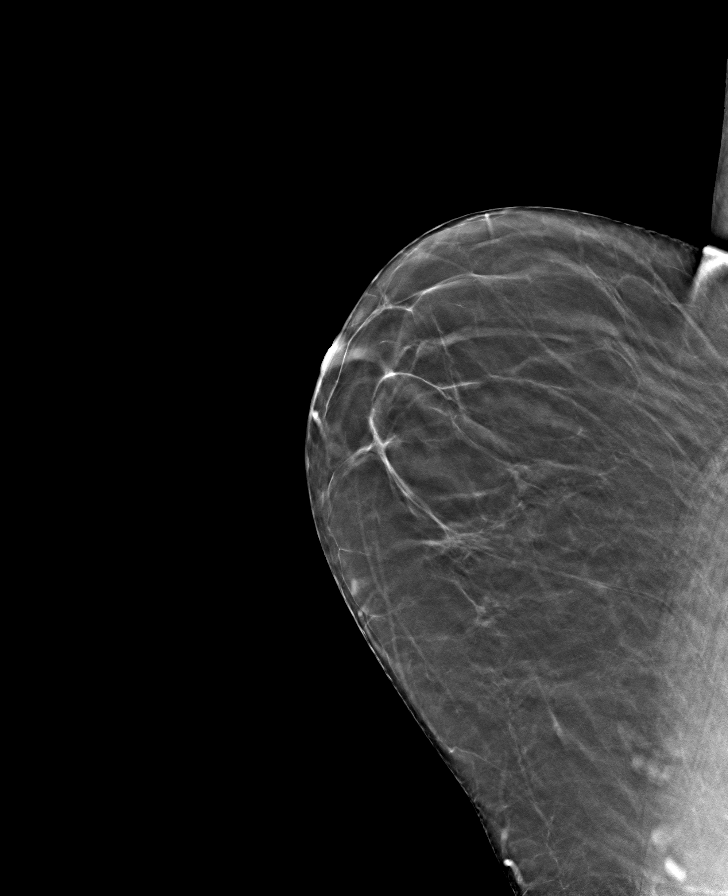

[8 of 24 positions shown; findings below may reference images not displayed]

ACR Breast Density Category b: There are scattered areas of
fibroglandular density.
FINDINGS: There are no findings suspicious for malignancy. Images were
processed with CAD.
IMPRESSION: No mammographic evidence of malignancy. A result letter of this
screening mammogram will be mailed directly to the patient.

RECOMMENDATION:
Screening mammogram in one year. (Code:[id number])

BI-RADS CATEGORY  1: Negative.

## 2021-11-22 ENCOUNTER — Other Ambulatory Visit (HOSPITAL_BASED_OUTPATIENT_CLINIC_OR_DEPARTMENT_OTHER): Payer: Medicare PPO

## 2021-11-22 ENCOUNTER — Ambulatory Visit (HOSPITAL_BASED_OUTPATIENT_CLINIC_OR_DEPARTMENT_OTHER): Payer: Medicare PPO

## 2021-12-13 ENCOUNTER — Inpatient Hospital Stay (HOSPITAL_BASED_OUTPATIENT_CLINIC_OR_DEPARTMENT_OTHER): Admission: RE | Admit: 2021-12-13 | Payer: Medicare PPO | Source: Ambulatory Visit

## 2021-12-13 ENCOUNTER — Other Ambulatory Visit (HOSPITAL_BASED_OUTPATIENT_CLINIC_OR_DEPARTMENT_OTHER): Payer: Medicare PPO

## 2021-12-20 ENCOUNTER — Ambulatory Visit (HOSPITAL_BASED_OUTPATIENT_CLINIC_OR_DEPARTMENT_OTHER)
Admission: RE | Admit: 2021-12-20 | Discharge: 2021-12-20 | Disposition: A | Discharge status: Home / Self Care | Payer: Medicare PPO | Source: Ambulatory Visit | Attending: Family Medicine | Admitting: Family Medicine

## 2021-12-20 DIAGNOSIS — R7303 Prediabetes: Secondary | ICD-10-CM | POA: Insufficient documentation

## 2021-12-20 DIAGNOSIS — I10 Essential (primary) hypertension: Secondary | ICD-10-CM | POA: Insufficient documentation

## 2021-12-20 DIAGNOSIS — E785 Hyperlipidemia, unspecified: Secondary | ICD-10-CM | POA: Insufficient documentation

## 2021-12-21 ENCOUNTER — Encounter: Payer: Self-pay | Admitting: Family Medicine

## 2021-12-21 DIAGNOSIS — I270 Primary pulmonary hypertension: Secondary | ICD-10-CM

## 2022-01-05 ENCOUNTER — Encounter: Payer: Self-pay | Admitting: Family Medicine

## 2022-01-05 ENCOUNTER — Ambulatory Visit (HOSPITAL_COMMUNITY): Payer: Medicare PPO | Attending: Cardiology

## 2022-01-05 DIAGNOSIS — I270 Primary pulmonary hypertension: Secondary | ICD-10-CM | POA: Insufficient documentation

## 2022-01-05 LAB — ECHOCARDIOGRAM COMPLETE
Area-P 1/2: 5.01 cm2
S' Lateral: 2.3 cm

## 2022-01-06 ENCOUNTER — Other Ambulatory Visit: Payer: Self-pay | Admitting: Family Medicine

## 2022-01-06 DIAGNOSIS — I270 Primary pulmonary hypertension: Secondary | ICD-10-CM

## 2022-01-18 NOTE — Progress Notes (Signed)
Cardiology Office Note:    Date:  01/21/2022   ID:  LAKEISHA WALDROP, DOB 1950/09/22, MRN [id number]  PCP:  Pearline Cables, MD   [facility] Providers Cardiologist:  None     Referring MD: Pearline Cables, MD   No chief complaint on file.   History of Present Illness:    Victoria Hogan is a 71 y.o. female is seen at the request of Dr Patsy Lager for evaluation of pulmonary HTN. She has a history of HTN and heart murmur. Echo in 2013 showed mild to moderate TR with mild pulmonary HTN. More recent Echo in August showed moderate TR with RV and RA dilation and mild pulmonary HTN. She also had a recent coronary calcium score of 0 but CT noted enlarged pulmonary artery at 4.1 cm. She was previously seen by Dr Elberta Fortis in 2018. Holter showed isolated PVCs with burden 4%. No further therapy recommended.   She reports that she has no symptoms of SOB, chest pain, palpitations or edema. Feels great and works at at the gym at least twice a week.   Past Medical History:  Diagnosis Date   Anxiety    GERD (gastroesophageal reflux disease)    Heart murmur    Hemorrhoids    History of colonic diverticulitis    Hyperlipidemia    Hypertension    Pneumonia 02/2012   PVC (premature ventricular contraction)     Past Surgical History:  Procedure Laterality Date   ABDOMINAL HYSTERECTOMY     bunyonectomy      Current Medications: Current Meds  Medication Sig   alendronate (FOSAMAX) 70 MG tablet TAKE 1 TABLET BY MOUTH WEEKLY, TAKE WITH A FULL GLASS OF WATER ON EMTPY STOMACH   FLUoxetine (PROZAC) 20 MG capsule TAKE 1 CAPSULE BY MOUTH DAILY *MAY INCREASE TO 2 CAPSULES DAILY AFTER 2-3 WEEKS*   lisinopril-hydrochlorothiazide (ZESTORETIC) 10-12.5 MG tablet Take 1 tablet by mouth daily.   lovastatin (MEVACOR) 20 MG tablet TAKE 1 TABLET BY MOUTH EVERYDAY AT BEDTIME     Allergies:   Patient has no known allergies.   Social History   Socioeconomic History   Marital status: Divorced     Spouse name: Not on file   Number of children: 2   Years of education: college   Highest education level: Not on file  Occupational History   Occupation: Magazine features editor: [employer]  Tobacco Use   Smoking status: Never   Smokeless tobacco: Never  Vaping Use   Vaping Use: Never used  Substance and Sexual Activity   Alcohol use: Yes    Alcohol/week: 4.0 standard drinks of alcohol    Types: 4 Glasses of wine per week    Comment: Sometimes more sometimes less   Drug use: Never   Sexual activity: Not Currently  Other Topics Concern   Not on file  Social History Narrative   Bikes daily for exercise.   Social Determinants of Health   Financial Resource Strain: Low Risk  (10/29/2020)   Overall Financial Resource Strain (CARDIA)    Difficulty of Paying Living Expenses: Not hard at all  Food Insecurity: No Food Insecurity (10/29/2020)   Hunger Vital Sign    Worried About Running Out of Food in the Last Year: Never true    Ran Out of Food in the Last Year: Never true  Transportation Needs: No Transportation Needs (10/29/2020)   PRAPARE - Administrator, Civil Service (Medical): No  Lack of Transportation (Non-Medical): No  Physical Activity: Insufficiently Active (10/29/2020)   Exercise Vital Sign    Days of Exercise per Week: 5 days    Minutes of Exercise per Session: 20 min  Stress: No Stress Concern Present (10/29/2020)   Bell    Feeling of Stress : Not at all  Social Connections: Moderately Isolated (10/29/2020)   Social Connection and Isolation Panel [NHANES]    Frequency of Communication with Friends and Family: More than three times a week    Frequency of Social Gatherings with Friends and Family: More than three times a week    Attends Religious Services: More than 4 times per year    Active Member of [employer] or Organizations: No    Attends Archivist Meetings: Never     Marital Status: Divorced     Family History: The patient's family history includes Arthritis in her mother; Heart failure in her father.  ROS:   Please see the history of present illness.     All other systems reviewed and are negative.  EKGs/Labs/Other Studies Reviewed:    The following studies were reviewed today: Echo 01/05/22: IMPRESSIONS     1. Left ventricular ejection fraction, by estimation, is 60 to 65%. The  left ventricle has normal function. The left ventricle has no regional  wall motion abnormalities. Left ventricular diastolic parameters are  consistent with Grade I diastolic  dysfunction (impaired relaxation).   2. Right ventricular systolic function is normal. The right ventricular  size is moderately enlarged. There is mildly elevated pulmonary artery  systolic pressure.   3. Left atrial size was moderately dilated.   4. Right atrial size was severely dilated.   5. The mitral valve is normal in structure. Trivial mitral valve  regurgitation. No evidence of mitral stenosis.   6. Tricuspid valve regurgitation is moderate.   7. The aortic valve is tricuspid. There is mild calcification of the  aortic valve. Aortic valve regurgitation is trivial. Aortic valve  sclerosis/calcification is present, without any evidence of aortic  stenosis.   8. The inferior vena cava is normal in size with greater than 50%  respiratory variability, suggesting right atrial pressure of 3 mmHg.   CLINICAL DATA:  44F for cardiovascular disease risk stratification   EXAM: Coronary Calcium Score   TECHNIQUE: A gated, non-contrast computed tomography scan of the heart was performed using 49mm slice thickness. Axial images were analyzed on a dedicated workstation. Calcium scoring of the coronary arteries was performed using the Agatston method.   FINDINGS: Coronary arteries: Normal origins.   Coronary Calcium Score: 0   Pericardium: Normal.   Ascending Aorta: Normal caliber.   2.8 cm.  Aortic atherosclerosis.   Main pulmonary artery dilated to 4.0 cm, suggestive of pulmonary artery hypertension.   Non-cardiac: See separate report from [facility].   IMPRESSION: Coronary calcium score of 0. This was 0 percentile for age-, race-, and sex-matched controls.   Aortic atherosclerosis.   Main pulmonary artery dilated to 4.0 cm, suggestive of pulmonary artery hypertension.  EKG:  EKG is  ordered today.  The ekg ordered today demonstrates NSR rate 76. Normal. I have personally reviewed and interpreted this study.   Recent Labs: 11/03/2021: ALT 32; BUN 11; Creatinine, Ser 0.71; Hemoglobin 13.1; Platelets 279.0; Potassium 4.8; Sodium 137; TSH 1.54  Recent Lipid Panel    Component Value Date/Time   CHOL 178 11/03/2021 0950   TRIG 68.0 11/03/2021 0950  HDL 64.70 11/03/2021 0950   CHOLHDL 3 11/03/2021 0950   VLDL 13.6 11/03/2021 0950   LDLCALC 100 (H) 11/03/2021 0950     Risk Assessment/Calculations:           Physical Exam:    VS:  BP (!) 148/78   Pulse 76   Ht 5' (1.524 m)   Wt 148 lb 12.8 oz (67.5 kg)   SpO2 98%   BMI 29.06 kg/m     Wt Readings from Last 3 Encounters:  01/21/22 148 lb 12.8 oz (67.5 kg)  11/03/21 149 lb 6.4 oz (67.8 kg)  06/30/21 155 lb 6.4 oz (70.5 kg)     GEN:  Well nourished, well developed in no acute distress HEENT: Normal NECK: No JVD; No carotid bruits LYMPHATICS: No lymphadenopathy CARDIAC: RRR, no murmurs, rubs, gallops RESPIRATORY:  Clear to auscultation without rales, wheezing or rhonchi  ABDOMEN: Soft, non-tender, non-distended MUSCULOSKELETAL:  No edema; No deformity  SKIN: Warm and dry NEUROLOGIC:  Alert and oriented x 3 PSYCHIATRIC:  Normal affect   ASSESSMENT:    1. Pulmonary HTN (HCC)    PLAN:    In order of problems listed above:  Pulmonary HTN. Mild by Echo estimation. No change from prior Echo 10 years ago. Patient is completely asymptomatic. Overall CV risk is very low with 0  calcium score and normal EF. Given lack of symptoms and stability of Echo findings I don't feel that any other evaluation is warranted. I have reassured her. Will follow up PRN           Medication Adjustments/Labs and Tests Ordered: Current medicines are reviewed at length with the patient today.  Concerns regarding medicines are outlined above.  No orders of the defined types were placed in this encounter.  No orders of the defined types were placed in this encounter.   There are no Patient Instructions on file for this visit.   Signed, Cyndi Montejano [place], MD  01/21/2022 2:53 PM    [facility]

## 2022-01-21 ENCOUNTER — Ambulatory Visit: Payer: Medicare PPO | Attending: Cardiology | Admitting: Cardiology

## 2022-01-21 ENCOUNTER — Encounter: Payer: Self-pay | Admitting: Cardiology

## 2022-01-21 VITALS — BP 148/78 | HR 76 | Ht 60.0 in | Wt 148.8 lb

## 2022-01-21 DIAGNOSIS — I272 Pulmonary hypertension, unspecified: Secondary | ICD-10-CM | POA: Diagnosis not present

## 2022-04-08 ENCOUNTER — Encounter: Payer: Self-pay | Admitting: Family Medicine

## 2022-04-08 DIAGNOSIS — F4321 Adjustment disorder with depressed mood: Secondary | ICD-10-CM

## 2022-04-08 MED ORDER — FLUOXETINE HCL 20 MG PO CAPS: ORAL_CAPSULE | ORAL | 2 refills | Status: DC

## 2022-07-28 ENCOUNTER — Ambulatory Visit: Payer: Medicare PPO | Admitting: Family Medicine

## 2022-08-05 NOTE — Progress Notes (Unsigned)
[facility] [address] [place], [place] [address] [phone]  Date:  08/10/2022   Name:  Victoria Hogan   DOB:  [birth year]   MRN:  [id number]  PCP:  Darreld Mclean, MD    Chief Complaint: No chief complaint on file.   History of Present Illness:  Victoria Hogan is a 72 y.o. very pleasant female patient who presents with the following:  Pt seen today with concern about her BP History of prediabetes, osteoporosis, hypertension, diverticulitis, vitamin D deficiency  Last seen by myself June of last year   Fosamax Fluoxetine 20 Lisinopril hctz Lovastatin   She was seen by Dr Martinique last fall for pulmonary HTN: Pulmonary HTN. Mild by Echo estimation. No change from prior Echo 10 years ago. Patient is completely asymptomatic. Overall CV risk is very low with 0 calcium score and normal EF. Given lack of symptoms and stability of Echo findings I don't feel that any other evaluation is warranted. I have reassured her. Will follow up PRN   Patient Active Problem List   Diagnosis Date Noted   Prediabetes 02/16/2021   Osteoporosis 08/30/2016   Overweight 08/22/2016   Hypertension 04/08/2012   Diverticulitis large intestine 03/23/2012   Vitamin D deficiency 03/14/2012   Heart murmur 03/06/2012    Past Medical History:  Diagnosis Date   Anxiety    GERD (gastroesophageal reflux disease)    Heart murmur    Hemorrhoids    History of colonic diverticulitis    Hyperlipidemia    Hypertension    Pneumonia 02/2012   PVC (premature ventricular contraction)     Past Surgical History:  Procedure Laterality Date   ABDOMINAL HYSTERECTOMY     bunyonectomy      Social History   Tobacco Use   Smoking status: Never   Smokeless tobacco: Never  Vaping Use   Vaping Use: Never used  Substance Use Topics   Alcohol use: Yes    Alcohol/week: 4.0 standard drinks of alcohol    Types: 4 Glasses of wine per week     Comment: Sometimes more sometimes less   Drug use: Never    Family History  Problem Relation Age of Onset   Arthritis Mother    Heart failure Father     No Known Allergies  Medication list has been reviewed and updated.  Current Outpatient Medications on File Prior to Visit  Medication Sig Dispense Refill   alendronate (FOSAMAX) 70 MG tablet TAKE 1 TABLET BY MOUTH WEEKLY, TAKE WITH A FULL GLASS OF WATER ON EMTPY STOMACH 12 tablet 3   FLUoxetine (PROZAC) 20 MG capsule TAKE 1 CAPSULE BY MOUTH DAILY *MAY INCREASE TO 2 CAPSULES DAILY AFTER 2-3 WEEKS* 180 capsule 2   ipratropium (ATROVENT) 0.03 % nasal spray Place 2 sprays into both nostrils every 12 (twelve) hours. (Patient not taking: Reported on 01/21/2022) 30 mL 12   lisinopril-hydrochlorothiazide (ZESTORETIC) 10-12.5 MG tablet Take 1 tablet by mouth daily. 90 tablet 3   lovastatin (MEVACOR) 20 MG tablet TAKE 1 TABLET BY MOUTH EVERYDAY AT BEDTIME 90 tablet 3   No current facility-administered medications on file prior to visit.    Review of Systems:  As per HPI- otherwise negative.   Physical Examination: There were no vitals filed for this visit. There were no vitals filed for this visit. There is no height or weight on file to calculate BMI. Ideal Body Weight:  GEN: no acute distress. HEENT: Atraumatic, Normocephalic.  Ears and Nose: No external deformity. CV: RRR, No M/G/R. No JVD. No thrill. No extra heart sounds. PULM: CTA B, no wheezes, crackles, rhonchi. No retractions. No resp. distress. No accessory muscle use. ABD: S, NT, ND, +BS. No rebound. No HSM. EXTR: No c/c/e PSYCH: Normally interactive. Conversant.    Assessment and Plan: ***  Signed Lamar Blinks, MD

## 2022-08-10 ENCOUNTER — Encounter: Payer: Self-pay | Admitting: Family Medicine

## 2022-08-10 ENCOUNTER — Telehealth: Payer: Self-pay

## 2022-08-10 ENCOUNTER — Ambulatory Visit: Payer: Medicare PPO | Admitting: Family Medicine

## 2022-08-10 VITALS — BP 132/80 | HR 77 | Temp 98.0°F | Resp 18 | Ht 60.0 in | Wt 152.0 lb

## 2022-08-10 DIAGNOSIS — F4321 Adjustment disorder with depressed mood: Secondary | ICD-10-CM | POA: Diagnosis not present

## 2022-08-10 DIAGNOSIS — I10 Essential (primary) hypertension: Secondary | ICD-10-CM | POA: Diagnosis not present

## 2022-08-10 DIAGNOSIS — R0982 Postnasal drip: Secondary | ICD-10-CM

## 2022-08-10 DIAGNOSIS — E785 Hyperlipidemia, unspecified: Secondary | ICD-10-CM

## 2022-08-10 DIAGNOSIS — Z131 Encounter for screening for diabetes mellitus: Secondary | ICD-10-CM | POA: Diagnosis not present

## 2022-08-10 LAB — COMPREHENSIVE METABOLIC PANEL
ALT: 16 U/L (ref 0–35)
AST: 21 U/L (ref 0–37)
Albumin: 4.5 g/dL (ref 3.5–5.2)
Alkaline Phosphatase: 77 U/L (ref 39–117)
BUN: 10 mg/dL (ref 6–23)
CO2: 28 mEq/L (ref 19–32)
Calcium: 9.2 mg/dL (ref 8.4–10.5)
Chloride: 99 mEq/L (ref 96–112)
Creatinine, Ser: 0.71 mg/dL (ref 0.40–1.20)
GFR: 85.24 mL/min (ref >60.00)
Glucose, Bld: 93 mg/dL (ref 70–99)
Potassium: 4.3 mEq/L (ref 3.5–5.1)
Sodium: 135 mEq/L (ref 135–145)
Total Bilirubin: 0.4 mg/dL (ref 0.2–1.2)
Total Protein: 7.1 g/dL (ref 6.0–8.3)

## 2022-08-10 LAB — CBC
HCT: 39.6 % (ref 36.0–46.0)
Hemoglobin: 13.6 g/dL (ref 12.0–15.0)
MCHC: 34.3 g/dL (ref 30.0–36.0)
MCV: 94.1 fl (ref 78.0–100.0)
Platelets: 290 10*3/uL (ref 150.0–400.0)
RBC: 4.2 Mil/uL (ref 3.87–5.11)
RDW: 12.7 % (ref 11.5–15.5)
WBC: 4.4 10*3/uL (ref 4.0–10.5)

## 2022-08-10 LAB — LIPID PANEL
Cholesterol: 180 mg/dL (ref 0–200)
HDL: 70 mg/dL (ref >39.00)
LDL Cholesterol: 99 mg/dL (ref 0–99)
NonHDL: 110.16
Total CHOL/HDL Ratio: 3
Triglycerides: 58 mg/dL (ref 0.0–149.0)
VLDL: 11.6 mg/dL (ref 0.0–40.0)

## 2022-08-10 LAB — HEMOGLOBIN A1C: Hgb A1c MFr Bld: 5.4 % (ref 4.6–6.5)

## 2022-08-10 LAB — TSH: TSH: 1.48 u[IU]/mL (ref 0.35–5.50)

## 2022-08-10 MED ORDER — ALENDRONATE SODIUM 70 MG PO TABS: ORAL_TABLET | ORAL | 3 refills | Status: AC

## 2022-08-10 MED ORDER — LOVASTATIN 20 MG PO TABS: ORAL_TABLET | ORAL | 3 refills | Status: DC

## 2022-08-10 MED ORDER — IPRATROPIUM BROMIDE 0.03 % NA SOLN: 2.0000 | Freq: Two times a day (BID) | NASAL | 12 refills | Status: DC

## 2022-08-10 MED ORDER — LISINOPRIL-HYDROCHLOROTHIAZIDE 10-12.5 MG PO TABS: 1.0000 | ORAL_TABLET | Freq: Every day | ORAL | 3 refills | Status: DC

## 2022-08-10 MED ORDER — FLUOXETINE HCL 20 MG PO CAPS: ORAL_CAPSULE | ORAL | 2 refills | Status: DC

## 2022-08-10 NOTE — Telephone Encounter (Signed)
Called patient and scheduled her for a Colonoscopy 10/26/22 @ 10:30am. Patient was also scheduled for a previsit 6/9.

## 2022-08-10 NOTE — Patient Instructions (Addendum)
Good to see you!  I will be in touch with your labs Mammo can be updated- please stop at imaging to set this up I sent a message to your GI- Dr Candis Schatz- to get you scheduled for your colon due this June  Recommend covid booster if not done in the last 6 months or so

## 2022-09-06 NOTE — Progress Notes (Signed)
[facility] at [facility] [address], [address] [place], [place] [address] [phone]  Date:  09/08/2022   Name:  Victoria Hogan   DOB:  07-Nov-1950   MRN:  [id number]  PCP:  Pearline Cables, MD    Chief Complaint: Recent Fall (Fall was about 2 weeks ago. She does have L Foot and knee pain. She does feel like her sxs are improving since the fall.)   History of Present Illness:  Victoria Hogan is a 72 y.o. very pleasant female patient who presents with the following:  Pt seen today following a recent fall/ foot and leg pain Last seen by myself about 4 weeks ago for a routine visit - at that time she was feeling very well History of prediabetes, osteoporosis, hypertension, diverticulitis, vitamin D deficiency - he has seen Dr [place] for concern of pulmonary HTN but he felt her disease was so mild that nothing further needed at this time   Colon cancer screening appears to be due- pt states scheduled for July   Pt notes she misjudged a curb 14 days ago- she tripped, injured her LEFT foot and fell onto her left knee She notes her knee and dorsum of the left foot hurt She was able to get up and walk but it did hurt quite a bit She notes she had significant bruising on the dorsum of the left foot but this is mostly gone  She did not hit her head She has a cane with her today which she does not otherwise usually need  Patient Active Problem List   Diagnosis Date Noted   Prediabetes 02/16/2021   Osteoporosis 08/30/2016   Overweight 08/22/2016   Hypertension 04/08/2012   Diverticulitis large intestine 03/23/2012   Vitamin D deficiency 03/14/2012   Heart murmur 03/06/2012    Past Medical History:  Diagnosis Date   Anxiety    GERD (gastroesophageal reflux disease)    Heart murmur    Hemorrhoids    History of colonic diverticulitis    Hyperlipidemia    Hypertension    Pneumonia 02/2012   PVC (premature ventricular contraction)      Past Surgical History:  Procedure Laterality Date   ABDOMINAL HYSTERECTOMY     bunyonectomy      Social History   Tobacco Use   Smoking status: Never   Smokeless tobacco: Never  Vaping Use   Vaping Use: Never used  Substance Use Topics   Alcohol use: Yes    Alcohol/week: 4.0 standard drinks of alcohol    Types: 4 Glasses of wine per week    Comment: Sometimes more sometimes less   Drug use: Never    Family History  Problem Relation Age of Onset   Arthritis Mother    Heart failure Father     No Known Allergies  Medication list has been reviewed and updated.  Current Outpatient Medications on File Prior to Visit  Medication Sig Dispense Refill   alendronate (FOSAMAX) 70 MG tablet TAKE 1 TABLET BY MOUTH WEEKLY, TAKE WITH A FULL GLASS OF WATER ON EMTPY STOMACH 12 tablet 3   FLUoxetine (PROZAC) 20 MG capsule TAKE 1 CAPSULE BY MOUTH DAILY *MAY INCREASE TO 2 CAPSULES DAILY AFTER 2-3 WEEKS* 180 capsule 2   ipratropium (ATROVENT) 0.03 % nasal spray Place 2 sprays into both nostrils every 12 (twelve) hours. 30 mL 12   lisinopril-hydrochlorothiazide (ZESTORETIC) 10-12.5 MG tablet Take 1 tablet by mouth daily. 90  tablet 3   lovastatin (MEVACOR) 20 MG tablet TAKE 1 TABLET BY MOUTH EVERYDAY AT BEDTIME 90 tablet 3   No current facility-administered medications on file prior to visit.    Review of Systems:  As per HPI- otherwise negative.   Physical Examination: Vitals:   09/08/22 1407  BP: 123/72  Pulse: 80  Resp: 18  Temp: 97.7 F (36.5 C)  SpO2: 98%   Vitals:   09/08/22 1407  Weight: 149 lb 3.2 oz (67.7 kg)  Height: 5' (1.524 m)   Body mass index is 29.14 kg/m. Ideal Body Weight: Weight in (lb) to have BMI = 25: 127.7  GEN: no acute distress.  Looks well HEENT: Atraumatic, Normocephalic.  Ears and Nose: No external deformity. CV: RRR, No M/G/R. No JVD. No thrill. No extra heart sounds. PULM: CTA B, no wheezes, crackles, rhonchi. No retractions. No resp.  distress. No accessory muscle use. EXTR: No c/c/e PSYCH: Normally interactive. Conversant.  Left knee- normal ROM, no significant tenderness or crepitus.  No effusion.  Overall negative  Left ankle is negative and normal, achilles intact  Left foot: she is tender over the 3rd and 4th MT with some swelling, bruising that she noted is now resolved   Assessment and Plan: Pain in left foot - Plan: DG Foot Complete Left  Pt seen today about 2 weeks after injuring her left foot in a fall.  She has not yet sought medical attention for this.  We discussed getting x-rays of her knee as well but she feels that her knee is ok.  Will get x-rays of her foot and call her asap  Signed Abbe Amsterdam, MD  Received her x-ray as below and called her- unfortunately she does appear to have possible fractures of the 2nd and 4th MT  I scheduled her an appt with [facility] at 8:45 am tomorrow;  In the meantime advised her to stay off her foot as much as she can   She does not have crutches or a walker.  Advised her that the most conservative management for this injury would be to get her immobilized right away- likely in a walking boot.  Let her know that [facility] also has an orthopedic urgent care she can go to now if she prefers- she may do this, but would like to have her son go with her as she is worn out  DG Foot Complete Left  Result Date: 09/08/2022 CLINICAL DATA:  Larey Seat.  Injured left foot. EXAM: LEFT FOOT - COMPLETE 3+ VIEW COMPARISON:  None Available. FINDINGS: The joint spaces are maintained. Remote surgical changes from a bunionectomy involving the first metatarsal. Possible fractures involving the bases of the second and fourth metatarsals. Recommend CT for further evaluation. IMPRESSION: Possible fractures involving the bases of the second and fourth metatarsals. For further evaluation. Recommend CT Electronically Signed   By: Rudie Meyer M.D.   On: 09/08/2022 15:16

## 2022-09-08 ENCOUNTER — Encounter: Payer: Self-pay | Admitting: Family Medicine

## 2022-09-08 ENCOUNTER — Ambulatory Visit: Payer: Medicare PPO | Admitting: Family Medicine

## 2022-09-08 ENCOUNTER — Ambulatory Visit (HOSPITAL_BASED_OUTPATIENT_CLINIC_OR_DEPARTMENT_OTHER)
Admission: RE | Admit: 2022-09-08 | Discharge: 2022-09-08 | Disposition: A | Discharge status: Home / Self Care | Payer: Medicare PPO | Source: Ambulatory Visit | Attending: Family Medicine | Admitting: Family Medicine

## 2022-09-08 VITALS — BP 123/72 | HR 80 | Temp 97.7°F | Resp 18 | Ht 60.0 in | Wt 149.2 lb

## 2022-09-08 DIAGNOSIS — M79672 Pain in left foot: Secondary | ICD-10-CM | POA: Insufficient documentation

## 2022-09-08 DIAGNOSIS — S99922A Unspecified injury of left foot, initial encounter: Secondary | ICD-10-CM | POA: Diagnosis not present

## 2022-09-08 DIAGNOSIS — M84375A Stress fracture, left foot, initial encounter for fracture: Secondary | ICD-10-CM | POA: Diagnosis not present

## 2022-09-08 NOTE — Patient Instructions (Signed)
Please stop by imaging on the ground floor and we will be in touch with your x-rays asap

## 2022-09-29 DIAGNOSIS — M84375D Stress fracture, left foot, subsequent encounter for fracture with routine healing: Secondary | ICD-10-CM | POA: Diagnosis not present

## 2022-10-07 DIAGNOSIS — R1032 Left lower quadrant pain: Secondary | ICD-10-CM | POA: Insufficient documentation

## 2022-10-07 DIAGNOSIS — K219 Gastro-esophageal reflux disease without esophagitis: Secondary | ICD-10-CM | POA: Insufficient documentation

## 2022-10-07 DIAGNOSIS — Z860100 Personal history of colon polyps, unspecified: Secondary | ICD-10-CM | POA: Insufficient documentation

## 2022-10-07 DIAGNOSIS — K600 Acute anal fissure: Secondary | ICD-10-CM | POA: Insufficient documentation

## 2022-10-07 DIAGNOSIS — K5732 Diverticulitis of large intestine without perforation or abscess without bleeding: Secondary | ICD-10-CM | POA: Insufficient documentation

## 2022-10-07 DIAGNOSIS — K625 Hemorrhage of anus and rectum: Secondary | ICD-10-CM | POA: Insufficient documentation

## 2022-10-13 ENCOUNTER — Other Ambulatory Visit: Payer: Self-pay

## 2022-10-13 ENCOUNTER — Ambulatory Visit (AMBULATORY_SURGERY_CENTER): Payer: Medicare PPO

## 2022-10-13 ENCOUNTER — Encounter: Payer: Self-pay | Admitting: Gastroenterology

## 2022-10-13 VITALS — Ht 60.0 in | Wt 150.0 lb

## 2022-10-13 DIAGNOSIS — Z86010 Personal history of colonic polyps: Secondary | ICD-10-CM

## 2022-10-13 MED ORDER — NA SULFATE-K SULFATE-MG SULF 17.5-3.13-1.6 GM/177ML PO SOLN
1.0000 | Freq: Once | ORAL | 0 refills | Status: AC
Start: 2022-10-13 — End: 2022-10-13

## 2022-10-13 NOTE — Progress Notes (Signed)
Denies allergies to eggs or soy products. Denies complication of anesthesia or sedation. Denies use of weight loss medication. Denies use of O2.   Emmi instructions given for colonoscopy.  

## 2022-10-26 ENCOUNTER — Encounter: Payer: Self-pay | Admitting: Gastroenterology

## 2022-10-26 ENCOUNTER — Ambulatory Visit (AMBULATORY_SURGERY_CENTER): Payer: Medicare PPO | Admitting: Gastroenterology

## 2022-10-26 VITALS — BP 124/72 | HR 72 | Temp 97.7°F | Resp 20 | Ht 60.0 in | Wt 150.0 lb

## 2022-10-26 DIAGNOSIS — Z860100 Personal history of colon polyps, unspecified: Secondary | ICD-10-CM

## 2022-10-26 DIAGNOSIS — D120 Benign neoplasm of cecum: Secondary | ICD-10-CM

## 2022-10-26 DIAGNOSIS — D124 Benign neoplasm of descending colon: Secondary | ICD-10-CM | POA: Diagnosis not present

## 2022-10-26 DIAGNOSIS — Z09 Encounter for follow-up examination after completed treatment for conditions other than malignant neoplasm: Secondary | ICD-10-CM | POA: Diagnosis not present

## 2022-10-26 DIAGNOSIS — K6389 Other specified diseases of intestine: Secondary | ICD-10-CM | POA: Diagnosis not present

## 2022-10-26 DIAGNOSIS — E785 Hyperlipidemia, unspecified: Secondary | ICD-10-CM | POA: Diagnosis not present

## 2022-10-26 DIAGNOSIS — F419 Anxiety disorder, unspecified: Secondary | ICD-10-CM | POA: Diagnosis not present

## 2022-10-26 DIAGNOSIS — K635 Polyp of colon: Secondary | ICD-10-CM | POA: Diagnosis not present

## 2022-10-26 DIAGNOSIS — I493 Ventricular premature depolarization: Secondary | ICD-10-CM | POA: Diagnosis not present

## 2022-10-26 DIAGNOSIS — I10 Essential (primary) hypertension: Secondary | ICD-10-CM | POA: Diagnosis not present

## 2022-10-26 MED ORDER — SODIUM CHLORIDE 0.9 % IV SOLN
500.0000 mL | Freq: Once | INTRAVENOUS | Status: DC
Start: 2022-10-26 — End: 2022-10-26

## 2022-10-26 NOTE — Progress Notes (Signed)
Sedate, gd SR, tolerated procedure well, VSS, report to RN 

## 2022-10-26 NOTE — Patient Instructions (Signed)
Discharge instructions given. Handouts on polyps and diverticulosis. Resume previous medications. YOU HAD AN ENDOSCOPIC PROCEDURE TODAY AT THE [facility]:   Refer to the procedure report that was given to you for any specific questions about what was found during the examination.  If the procedure report does not answer your questions, please call your gastroenterologist to clarify.  If you requested that your care partner not be given the details of your procedure findings, then the procedure report has been included in a sealed envelope for you to review at your convenience later.  YOU SHOULD EXPECT: Some feelings of bloating in the abdomen. Passage of more gas than usual.  Walking can help get rid of the air that was put into your GI tract during the procedure and reduce the bloating. If you had a lower endoscopy (such as a colonoscopy or flexible sigmoidoscopy) you may notice spotting of blood in your stool or on the toilet paper. If you underwent a bowel prep for your procedure, you may not have a normal bowel movement for a few days.  Please Note:  You might notice some irritation and congestion in your nose or some drainage.  This is from the oxygen used during your procedure.  There is no need for concern and it should clear up in a day or so.  SYMPTOMS TO REPORT IMMEDIATELY:  Following lower endoscopy (colonoscopy or flexible sigmoidoscopy):  Excessive amounts of blood in the stool  Significant tenderness or worsening of abdominal pains  Swelling of the abdomen that is new, acute  Fever of 100F or higher   For urgent or emergent issues, a gastroenterologist can be reached at any hour by calling [phone]. Do not use MyChart messaging for urgent concerns.    DIET:  We do recommend a small meal at first, but then you may proceed to your regular diet.  Drink plenty of fluids but you should avoid alcoholic beverages for 24 hours.  ACTIVITY:  You should plan to take it  easy for the rest of today and you should NOT DRIVE or use heavy machinery until tomorrow (because of the sedation medicines used during the test).    FOLLOW UP: Our staff will call the number listed on your records the next business day following your procedure.  We will call around 7:15- 8:00 am to check on you and address any questions or concerns that you may have regarding the information given to you following your procedure. If we do not reach you, we will leave a message.     If any biopsies were taken you will be contacted by phone or by letter within the next 1-3 weeks.  Please call us at [phone] if you have not heard about the biopsies in 3 weeks.    SIGNATURES/CONFIDENTIALITY: You and/or your care partner have signed paperwork which will be entered into your electronic medical record.  These signatures attest to the fact that that the information above on your After Visit Summary has been reviewed and is understood.  Full responsibility of the confidentiality of this discharge information lies with you and/or your care-partner. 

## 2022-10-26 NOTE — Progress Notes (Signed)
Called to room to assist during endoscopic procedure.  Patient ID and intended procedure confirmed with present staff. Received instructions for my participation in the procedure from the performing physician.  

## 2022-10-26 NOTE — Op Note (Signed)
[facility] Patient Name: Victoria Hogan Procedure Date: 10/26/2022 10:00 AM MRN: [id number] Endoscopist: Lorin Picket E. Tomasa Rand , MD, [id number] Age: 72 Referring MD:  Date of Birth: 1951-03-31 Gender: Female Account #: [id number] Procedure:                Colonoscopy Indications:              Surveillance: Personal history of adenomatous                            polyps on last colonoscopy > 5 years ago Medicines:                Monitored Anesthesia Care Procedure:                Pre-Anesthesia Assessment:                           - Prior to the procedure, a History and Physical                            was performed, and patient medications and                            allergies were reviewed. The patient's tolerance of                            previous anesthesia was also reviewed. The risks                            and benefits of the procedure and the sedation                            options and risks were discussed with the patient.                            All questions were answered, and informed consent                            was obtained. Prior Anticoagulants: The patient has                            taken no anticoagulant or antiplatelet agents. ASA                            Grade Assessment: II - A patient with mild systemic                            disease. After reviewing the risks and benefits,                            the patient was deemed in satisfactory condition to                            undergo the procedure.  After obtaining informed consent, the colonoscope                            was passed under direct vision. Throughout the                            procedure, the patient's blood pressure, pulse, and                            oxygen saturations were monitored continuously. The                            CF [id number] #[id number] was introduced through the anus                            and advanced to  the the cecum, identified by                            appendiceal orifice and ileocecal valve. The                            colonoscopy was performed without difficulty. The                            patient tolerated the procedure well. The quality                            of the bowel preparation was adequate. The                            ileocecal valve, appendiceal orifice, and rectum                            were photographed. The bowel preparation used was                            GoLYTELY via split dose instruction. Scope In: 10:16:35 AM Scope Out: 10:40:03 AM Scope Withdrawal Time: 0 hours 16 minutes 22 seconds  Total Procedure Duration: 0 hours 23 minutes 28 seconds  Findings:                 The perianal and digital rectal examinations were                            normal. Pertinent negatives include normal                            sphincter tone and no palpable rectal lesions.                           A 1 mm polyp was found in the cecum. The polyp was                            sessile. The polyp was removed with  a cold biopsy                            forceps. Resection and retrieval were complete.                            Estimated blood loss was minimal.                           Two flat polyps were found in the descending colon.                            The polyps were 3 to 4 mm in size. These polyps                            were removed with a cold snare. Resection and                            retrieval were complete. Estimated blood loss was                            minimal.                           Many medium-mouthed and small-mouthed diverticula                            were found in the sigmoid colon, descending colon,                            transverse colon and ascending colon. There was                            luminal narrowing with erythematous mucosa in the                            sigmoid colon. Biopsies were taken with a  cold                            forceps for histology. Estimated blood loss was                            minimal.                           The exam was otherwise normal throughout the                            examined colon.                           The retroflexed view of the distal rectum and anal                            verge was normal and showed no  anal or rectal                            abnormalities. Complications:            No immediate complications. Estimated Blood Loss:     Estimated blood loss was minimal. Impression:               - One 1 mm polyp in the cecum, removed with a cold                            biopsy forceps. Resected and retrieved.                           - Two 3 to 4 mm polyps in the descending colon,                            removed with a cold snare. Resected and retrieved.                           - Severe diverticulosis in the sigmoid colon, with                            mild-moderate diverticulosis in the descending                            colon, in the transverse colon and in the ascending                            colon. There was narrowing of the colon in the                            sigmoid with inflamed appearing mucosa, likely from                            mucosal prolpase. Biopsied. This did not appear                            malignant                           - The distal rectum and anal verge are normal on                            retroflexion view. Recommendation:           - Patient has a contact number available for                            emergencies. The signs and symptoms of potential                            delayed complications were discussed with the  patient. Return to normal activities tomorrow.                            Written discharge instructions were provided to the                            patient.                           - Resume previous diet.                            - Continue present medications.                           - Await pathology results.                           - Repeat colonoscopy (date not yet determined) for                            surveillance based on pathology results.                           - Recommend high fiber diet/daily fiber                            supplementation to reduce risk of diverticular                            complications. Nekeshia Lenhardt E. Tomasa Rand, MD 10/26/2022 10:52:14 AM This report has been signed electronically.

## 2022-10-26 NOTE — Progress Notes (Signed)
[facility] History and Physical   Primary Care Physician:  Copland, Gwenlyn Found, MD   Reason for Procedure:   History of colon polyps  Plan:    Colonoscopy     HPI: Victoria Hogan is a 72 y.o. female undergoing screening colonoscopy/polyp surveillance.  She has no family history of colon cancer and no chronic GI symptoms.  Her last colonoscopy was in 2017 and a small polyp in the hepatic flexure was removed, pathology report indicated hyperplastic polyp.  She was originally recommended to repeat colonoscopy in 5 years, but was changed to 7 years based on updated polyp surveillance guidelines.  Past Medical History:  Diagnosis Date   Allergy    Anxiety    GERD (gastroesophageal reflux disease)    Heart murmur    Hemorrhoids    History of colonic diverticulitis    Hyperlipidemia    Hypertension    Osteoporosis    Pneumonia 02/2012   PVC (premature ventricular contraction)     Past Surgical History:  Procedure Laterality Date   ABDOMINAL HYSTERECTOMY     bunyonectomy      Prior to Admission medications   Medication Sig Start Date End Date Taking? Authorizing Provider  alendronate (FOSAMAX) 70 MG tablet TAKE 1 TABLET BY MOUTH WEEKLY, TAKE WITH A FULL GLASS OF WATER ON EMTPY STOMACH 08/10/22  Yes Copland, Gwenlyn Found, MD  FLUoxetine (PROZAC) 20 MG capsule TAKE 1 CAPSULE BY MOUTH DAILY *MAY INCREASE TO 2 CAPSULES DAILY AFTER 2-3 WEEKS* 08/10/22  Yes Copland, Gwenlyn Found, MD  ipratropium (ATROVENT) 0.03 % nasal spray Place 2 sprays into both nostrils every 12 (twelve) hours. 08/10/22  Yes Copland, Gwenlyn Found, MD  lisinopril-hydrochlorothiazide (ZESTORETIC) 10-12.5 MG tablet Take 1 tablet by mouth daily. 08/10/22  Yes Copland, Gwenlyn Found, MD  lovastatin (MEVACOR) 20 MG tablet TAKE 1 TABLET BY MOUTH EVERYDAY AT BEDTIME 08/10/22  Yes Copland, Gwenlyn Found, MD    Current Outpatient Medications  Medication Sig Dispense Refill   alendronate (FOSAMAX) 70 MG tablet TAKE 1 TABLET BY MOUTH  WEEKLY, TAKE WITH A FULL GLASS OF WATER ON EMTPY STOMACH 12 tablet 3   FLUoxetine (PROZAC) 20 MG capsule TAKE 1 CAPSULE BY MOUTH DAILY *MAY INCREASE TO 2 CAPSULES DAILY AFTER 2-3 WEEKS* 180 capsule 2   ipratropium (ATROVENT) 0.03 % nasal spray Place 2 sprays into both nostrils every 12 (twelve) hours. 30 mL 12   lisinopril-hydrochlorothiazide (ZESTORETIC) 10-12.5 MG tablet Take 1 tablet by mouth daily. 90 tablet 3   lovastatin (MEVACOR) 20 MG tablet TAKE 1 TABLET BY MOUTH EVERYDAY AT BEDTIME 90 tablet 3   Current Facility-Administered Medications  Medication Dose Route Frequency Provider Last Rate Last Admin   0.9 %  sodium chloride infusion  500 mL Intravenous Once Jenel Lucks, MD        Allergies as of 10/26/2022   (No Known Allergies)    Family History  Problem Relation Age of Onset   Arthritis Mother    Heart failure Father    Colon cancer Neg Hx    Esophageal cancer Neg Hx    Stomach cancer Neg Hx    Rectal cancer Neg Hx     Social History   Socioeconomic History   Marital status: Divorced    Spouse name: Not on file   Number of children: 2   Years of education: college   Highest education level: Bachelor's degree (e.g., BA, AB, BS)  Occupational History   Occupation: Runner, broadcasting/film/video  Employer: [employer]  Tobacco Use   Smoking status: Never   Smokeless tobacco: Never  Vaping Use   Vaping Use: Never used  Substance and Sexual Activity   Alcohol use: Yes    Alcohol/week: 4.0 standard drinks of alcohol    Types: 4 Glasses of wine per week    Comment: Sometimes more sometimes less   Drug use: Never   Sexual activity: Not Currently  Other Topics Concern   Not on file  Social History Narrative   Bikes daily for exercise.   Social Determinants of Health   Financial Resource Strain: Low Risk  (08/05/2022)   Overall Financial Resource Strain (CARDIA)    Difficulty of Paying Living Expenses: Not very hard  Food Insecurity: No Food Insecurity  (08/05/2022)   Hunger Vital Sign    Worried About Running Out of Food in the Last Year: Never true    Ran Out of Food in the Last Year: Never true  Transportation Needs: No Transportation Needs (08/05/2022)   PRAPARE - Administrator, Civil Service (Medical): No    Lack of Transportation (Non-Medical): No  Physical Activity: Insufficiently Active (08/05/2022)   Exercise Vital Sign    Days of Exercise per Week: 4 days    Minutes of Exercise per Session: 20 min  Stress: No Stress Concern Present (08/05/2022)   [employer] of Occupational Health - Occupational Stress Questionnaire    Feeling of Stress : Only a little  Social Connections: Moderately Integrated (08/05/2022)   Social Connection and Isolation Panel [NHANES]    Frequency of Communication with Friends and Family: More than three times a week    Frequency of Social Gatherings with Friends and Family: Twice a week    Attends Religious Services: More than 4 times per year    Active Member of [employer] or Organizations: Yes    Attends Engineer, structural: More than 4 times per year    Marital Status: Divorced  Intimate Partner Violence: Not At Risk (10/29/2020)   Humiliation, Afraid, Rape, and Kick questionnaire    Fear of Current or Ex-Partner: No    Emotionally Abused: No    Physically Abused: No    Sexually Abused: No    Review of Systems:  All other review of systems negative except as mentioned in the HPI.  Physical Exam: Vital signs BP (!) 148/91   Pulse 80   Temp 97.7 F (36.5 C) (Skin)   Ht 5' (1.524 m)   Wt 150 lb (68 kg)   SpO2 97%   BMI 29.29 kg/m   General:   Alert,  Well-developed, well-nourished, pleasant and cooperative in NAD Airway:  Mallampati 1 Lungs:  Clear throughout to auscultation.   Heart:  Regular rate and rhythm; no murmurs, clicks, rubs,  or gallops. Abdomen:  Soft, nontender and nondistended. Normal bowel sounds.   Neuro/Psych:  Normal mood and affect. A and O x  3   Jalan Bodi E. Tomasa Rand, MD [facility]

## 2022-10-26 NOTE — Progress Notes (Signed)
Pt's states no medical or surgical changes since previsit or office visit. 

## 2022-10-27 ENCOUNTER — Telehealth: Payer: Self-pay | Admitting: *Deleted

## 2022-10-27 NOTE — Telephone Encounter (Signed)
  Follow up Call-     10/26/2022    9:50 AM  Call back number  Post procedure Call Back phone  # [phone]  Permission to leave phone message Yes     Patient questions:  Do you have a fever, pain , or abdominal swelling? No. Pain Score  0 *  Have you tolerated food without any problems? Yes.    Have you been able to return to your normal activities? Yes.    Do you have any questions about your discharge instructions: Diet   No. Medications  No. Follow up visit  No.  Do you have questions or concerns about your Care? No.  Actions: * If pain score is 4 or above: No action needed, pain <4.

## 2022-10-31 NOTE — Progress Notes (Signed)
Victoria Hogan,  None of the polyps removed were precancerous.  Based on your age and lack of precancerous polyps, I would recommend against further colon cancer screening.  The biopsies of the inflamed appearing mucosa in your left colon showed benign inflammatory changes (colitis).  Based on the appearance of the mucosa and the association with diverticular disease, this is most likely segmental colitis associated with diverticulosis (SCAD).  This can be a source of abdominal pain, but if you are not having any such symptoms, I do not recommend any medical treatment.  This does not increase your risk for colon cancer or other complications.  Please follow a high fiber diet or take a daily fiber supplement to reduce the risk of diverticulitis.

## 2022-11-07 ENCOUNTER — Encounter: Payer: Medicare PPO | Admitting: Family Medicine

## 2022-11-08 ENCOUNTER — Telehealth: Payer: Self-pay | Admitting: Family Medicine

## 2022-11-08 NOTE — Telephone Encounter (Signed)
Copied from CRM [id number]. Topic: Medicare AWV >> Nov 08, 2022  9:59 AM Payton Doughty wrote: Reason for CRM: LM 11/08/2022 to schedule AWV   Verlee Rossetti; Care Guide Ambulatory Clinical Support [place] [facility] Direct Dial: [phone]

## 2023-03-17 ENCOUNTER — Encounter: Payer: Self-pay | Admitting: Gastroenterology

## 2023-03-31 ENCOUNTER — Other Ambulatory Visit (HOSPITAL_COMMUNITY): Payer: Self-pay

## 2023-07-28 ENCOUNTER — Ambulatory Visit (HOSPITAL_COMMUNITY)

## 2023-08-09 NOTE — Patient Instructions (Incomplete)
 Good to see you again today- I will be in touch with your labs Recommend a covid booster if none in the last 6 months or so  Also looks like you are due for a tetanus booster - please have this done at your pharmacy I ordered a bone density and mammogram for you; please stop by imaging on the ground floor and set these up  I do think cutting back on alcohol and losing 10lbs or so would help your BP and general health Perhaps try drinking "hop water," an alcohol and calorie free alternative to beer that you might like.  You might also try taking a walk during usual cocktail hour

## 2023-08-09 NOTE — Progress Notes (Unsigned)
 [facility] [address] [place], [place] [address] [phone]  Date:  08/10/2023   Name:  Victoria Hogan   DOB:  [birth year]   MRN:  [id number]  PCP:  Pearline Cables, MD    Chief Complaint: No chief complaint on file.   History of Present Illness:  Victoria Hogan is a 73 y.o. very pleasant female patient who presents with the following:  Pt seen today to discuss medications Last seen by myself about one year ago History of prediabetes, osteoporosis, hypertension, diverticulitis, vitamin D deficiency - he has seen Dr [place] for concern of pulmonary HTN but he felt her disease was so mild that nothing further needed   She had her colonoscopy last year  Tetanus Mammo 8/22- can update  Dexa can be updated  Covid booster Flu shot RSV Labs:  can be updated   Fosamax Prozac Lovastatin Lisinopril/hctz Patient Active Problem List   Diagnosis Date Noted   Acute anal fissure 10/07/2022   Gastro-esophageal reflux disease without esophagitis 10/07/2022   Hemorrhage of rectum and anus 10/07/2022   Rectal bleeding 10/07/2022   Left lower quadrant pain 10/07/2022   History of colonic polyps 10/07/2022   Diverticulitis of colon 10/07/2022   Prediabetes 02/16/2021   Osteoporosis 08/30/2016   Overweight 08/22/2016   Anxiety 05/09/2012   Hypertension 04/08/2012   Diverticulitis large intestine 03/23/2012   Vitamin D deficiency 03/14/2012   Heart murmur 03/06/2012    Past Medical History:  Diagnosis Date   Allergy    Anxiety    GERD (gastroesophageal reflux disease)    Heart murmur    Hemorrhoids    History of colonic diverticulitis    Hyperlipidemia    Hypertension    Osteoporosis    Pneumonia 02/2012   PVC (premature ventricular contraction)     Past Surgical History:  Procedure Laterality Date   ABDOMINAL HYSTERECTOMY     bunyonectomy      Social History   Tobacco Use   Smoking status: Never    Smokeless tobacco: Never  Vaping Use   Vaping status: Never Used  Substance Use Topics   Alcohol use: Yes    Alcohol/week: 4.0 standard drinks of alcohol    Types: 4 Glasses of wine per week    Comment: Sometimes more sometimes less   Drug use: Never    Family History  Problem Relation Age of Onset   Arthritis Mother    Heart failure Father    Colon cancer Neg Hx    Esophageal cancer Neg Hx    Stomach cancer Neg Hx    Rectal cancer Neg Hx     No Known Allergies  Medication list has been reviewed and updated.  Current Outpatient Medications on File Prior to Visit  Medication Sig Dispense Refill   alendronate (FOSAMAX) 70 MG tablet TAKE 1 TABLET BY MOUTH WEEKLY, TAKE WITH A FULL GLASS OF WATER ON EMTPY STOMACH 12 tablet 3   FLUoxetine (PROZAC) 20 MG capsule TAKE 1 CAPSULE BY MOUTH DAILY *MAY INCREASE TO 2 CAPSULES DAILY AFTER 2-3 WEEKS* 180 capsule 2   ipratropium (ATROVENT) 0.03 % nasal spray Place 2 sprays into both nostrils every 12 (twelve) hours. 30 mL 12   lisinopril-hydrochlorothiazide (ZESTORETIC) 10-12.5 MG tablet Take 1 tablet by mouth daily. 90 tablet 3   lovastatin (MEVACOR) 20 MG tablet TAKE 1 TABLET BY MOUTH EVERYDAY AT BEDTIME 90 tablet 3   No  current facility-administered medications on file prior to visit.    Review of Systems:  As per HPI- otherwise negative.    Physical Examination: There were no vitals filed for this visit. There were no vitals filed for this visit. There is no height or weight on file to calculate BMI. Ideal Body Weight:    GEN: no acute distress. HEENT: Atraumatic, Normocephalic.  Ears and Nose: No external deformity. CV: RRR, No M/G/R. No JVD. No thrill. No extra heart sounds. PULM: CTA B, no wheezes, crackles, rhonchi. No retractions. No resp. distress. No accessory muscle use. ABD: S, NT, ND, +BS. No rebound. No HSM. EXTR: No c/c/e PSYCH: Normally interactive. Conversant.    Assessment and  Plan: ***  Signed Abbe Amsterdam, MD

## 2023-08-10 ENCOUNTER — Other Ambulatory Visit (HOSPITAL_BASED_OUTPATIENT_CLINIC_OR_DEPARTMENT_OTHER): Payer: Self-pay

## 2023-08-10 ENCOUNTER — Encounter: Payer: Self-pay | Admitting: Family Medicine

## 2023-08-10 ENCOUNTER — Ambulatory Visit (INDEPENDENT_AMBULATORY_CARE_PROVIDER_SITE_OTHER): Admitting: Family Medicine

## 2023-08-10 VITALS — BP 142/82 | HR 82 | Temp 98.2°F | Resp 18 | Ht 60.0 in | Wt 153.4 lb

## 2023-08-10 DIAGNOSIS — R7303 Prediabetes: Secondary | ICD-10-CM

## 2023-08-10 DIAGNOSIS — M810 Age-related osteoporosis without current pathological fracture: Secondary | ICD-10-CM | POA: Diagnosis not present

## 2023-08-10 DIAGNOSIS — Z1231 Encounter for screening mammogram for malignant neoplasm of breast: Secondary | ICD-10-CM

## 2023-08-10 DIAGNOSIS — Z1329 Encounter for screening for other suspected endocrine disorder: Secondary | ICD-10-CM

## 2023-08-10 DIAGNOSIS — R300 Dysuria: Secondary | ICD-10-CM | POA: Diagnosis not present

## 2023-08-10 DIAGNOSIS — E559 Vitamin D deficiency, unspecified: Secondary | ICD-10-CM | POA: Diagnosis not present

## 2023-08-10 DIAGNOSIS — I10 Essential (primary) hypertension: Secondary | ICD-10-CM

## 2023-08-10 DIAGNOSIS — E785 Hyperlipidemia, unspecified: Secondary | ICD-10-CM | POA: Diagnosis not present

## 2023-08-10 LAB — POCT URINALYSIS DIP (MANUAL ENTRY)
Bilirubin, UA: NEGATIVE
Blood, UA: NEGATIVE
Glucose, UA: NEGATIVE mg/dL
Ketones, POC UA: NEGATIVE mg/dL
Nitrite, UA: NEGATIVE
Protein Ur, POC: NEGATIVE mg/dL
Spec Grav, UA: <=1.005 — AB (ref 1.010–1.025)
Urobilinogen, UA: 0.2 U/dL
pH, UA: 6 (ref 5.0–8.0)

## 2023-08-10 LAB — LIPID PANEL
Cholesterol: 161 mg/dL (ref 0–200)
HDL: 59 mg/dL (ref >39.00)
LDL Cholesterol: 90 mg/dL (ref 0–99)
NonHDL: 101.71
Total CHOL/HDL Ratio: 3
Triglycerides: 61 mg/dL (ref 0.0–149.0)
VLDL: 12.2 mg/dL (ref 0.0–40.0)

## 2023-08-10 LAB — COMPREHENSIVE METABOLIC PANEL WITH GFR
ALT: 14 U/L (ref 0–35)
AST: 18 U/L (ref 0–37)
Albumin: 4.5 g/dL (ref 3.5–5.2)
Alkaline Phosphatase: 77 U/L (ref 39–117)
BUN: 14 mg/dL (ref 6–23)
CO2: 29 meq/L (ref 19–32)
Calcium: 9.6 mg/dL (ref 8.4–10.5)
Chloride: 99 meq/L (ref 96–112)
Creatinine, Ser: 0.82 mg/dL (ref 0.40–1.20)
GFR: 71.21 mL/min (ref >60.00)
Glucose, Bld: 93 mg/dL (ref 70–99)
Potassium: 4.6 meq/L (ref 3.5–5.1)
Sodium: 135 meq/L (ref 135–145)
Total Bilirubin: 0.5 mg/dL (ref 0.2–1.2)
Total Protein: 7.3 g/dL (ref 6.0–8.3)

## 2023-08-10 LAB — HEMOGLOBIN A1C: Hgb A1c MFr Bld: 5.7 % (ref 4.6–6.5)

## 2023-08-10 LAB — CBC
HCT: 39 % (ref 36.0–46.0)
Hemoglobin: 13 g/dL (ref 12.0–15.0)
MCHC: 33.4 g/dL (ref 30.0–36.0)
MCV: 95.3 fl (ref 78.0–100.0)
Platelets: 311 10*3/uL (ref 150.0–400.0)
RBC: 4.09 Mil/uL (ref 3.87–5.11)
RDW: 12.6 % (ref 11.5–15.5)
WBC: 4.5 10*3/uL (ref 4.0–10.5)

## 2023-08-10 LAB — TSH: TSH: 1.97 u[IU]/mL (ref 0.35–5.50)

## 2023-08-10 LAB — VITAMIN D 25 HYDROXY (VIT D DEFICIENCY, FRACTURES): VITD: 18.73 ng/mL — ABNORMAL LOW (ref 30.00–100.00)

## 2023-08-10 MED ORDER — VITAMIN D3 1.25 MG (50000 UT) PO CAPS
50000.0000 [IU] | ORAL_CAPSULE | ORAL | 0 refills | Status: DC
  Filled 2023-08-10: qty 12, 84d supply, fill #0

## 2023-08-10 MED ORDER — CEPHALEXIN 500 MG PO CAPS
500.0000 mg | ORAL_CAPSULE | Freq: Two times a day (BID) | ORAL | 0 refills | Status: DC
Start: 2023-08-10 — End: 2023-08-10
  Filled 2023-08-10: qty 14, 7d supply, fill #0

## 2023-08-10 MED ORDER — CEPHALEXIN 500 MG PO CAPS
500.0000 mg | ORAL_CAPSULE | Freq: Two times a day (BID) | ORAL | 0 refills | Status: DC
  Filled 2023-08-10: qty 14, 7d supply, fill #0

## 2023-08-11 ENCOUNTER — Other Ambulatory Visit (HOSPITAL_BASED_OUTPATIENT_CLINIC_OR_DEPARTMENT_OTHER): Payer: Self-pay

## 2023-08-11 ENCOUNTER — Other Ambulatory Visit: Payer: Self-pay | Admitting: Family Medicine

## 2023-08-11 DIAGNOSIS — F4321 Adjustment disorder with depressed mood: Secondary | ICD-10-CM

## 2023-08-12 ENCOUNTER — Encounter: Payer: Self-pay | Admitting: Family Medicine

## 2023-08-12 ENCOUNTER — Other Ambulatory Visit: Payer: Self-pay | Admitting: Family Medicine

## 2023-08-12 DIAGNOSIS — E785 Hyperlipidemia, unspecified: Secondary | ICD-10-CM

## 2023-08-12 DIAGNOSIS — I10 Essential (primary) hypertension: Secondary | ICD-10-CM

## 2023-08-12 LAB — URINE CULTURE
MICRO NUMBER:: 16285028
SPECIMEN QUALITY:: ADEQUATE

## 2023-08-18 ENCOUNTER — Encounter: Payer: Self-pay | Admitting: Family Medicine

## 2023-08-21 ENCOUNTER — Encounter (HOSPITAL_BASED_OUTPATIENT_CLINIC_OR_DEPARTMENT_OTHER): Payer: Self-pay

## 2023-08-21 ENCOUNTER — Other Ambulatory Visit (HOSPITAL_BASED_OUTPATIENT_CLINIC_OR_DEPARTMENT_OTHER)

## 2023-08-21 ENCOUNTER — Inpatient Hospital Stay (HOSPITAL_BASED_OUTPATIENT_CLINIC_OR_DEPARTMENT_OTHER): Admission: RE | Admit: 2023-08-21 | Source: Ambulatory Visit

## 2023-08-21 ENCOUNTER — Ambulatory Visit (HOSPITAL_BASED_OUTPATIENT_CLINIC_OR_DEPARTMENT_OTHER)

## 2023-08-29 ENCOUNTER — Telehealth (HOSPITAL_BASED_OUTPATIENT_CLINIC_OR_DEPARTMENT_OTHER): Payer: Self-pay

## 2023-09-19 ENCOUNTER — Telehealth (HOSPITAL_BASED_OUTPATIENT_CLINIC_OR_DEPARTMENT_OTHER): Payer: Self-pay

## 2023-09-25 ENCOUNTER — Ambulatory Visit (HOSPITAL_BASED_OUTPATIENT_CLINIC_OR_DEPARTMENT_OTHER)
Admission: RE | Admit: 2023-09-25 | Discharge: 2023-09-25 | Disposition: A | Discharge status: Home / Self Care | Source: Ambulatory Visit | Attending: Family Medicine | Admitting: Family Medicine

## 2023-09-25 ENCOUNTER — Other Ambulatory Visit (HOSPITAL_BASED_OUTPATIENT_CLINIC_OR_DEPARTMENT_OTHER)

## 2023-09-25 ENCOUNTER — Encounter (HOSPITAL_BASED_OUTPATIENT_CLINIC_OR_DEPARTMENT_OTHER): Payer: Self-pay

## 2023-09-25 DIAGNOSIS — Z1231 Encounter for screening mammogram for malignant neoplasm of breast: Secondary | ICD-10-CM | POA: Diagnosis not present

## 2023-10-30 ENCOUNTER — Ambulatory Visit (HOSPITAL_BASED_OUTPATIENT_CLINIC_OR_DEPARTMENT_OTHER)
Admission: RE | Admit: 2023-10-30 | Discharge: 2023-10-30 | Disposition: A | Discharge status: Home / Self Care | Source: Ambulatory Visit | Attending: Family Medicine | Admitting: Family Medicine

## 2023-10-30 DIAGNOSIS — M810 Age-related osteoporosis without current pathological fracture: Secondary | ICD-10-CM | POA: Insufficient documentation

## 2023-10-30 DIAGNOSIS — Z780 Asymptomatic menopausal state: Secondary | ICD-10-CM | POA: Diagnosis not present

## 2023-10-30 DIAGNOSIS — M8589 Other specified disorders of bone density and structure, multiple sites: Secondary | ICD-10-CM | POA: Diagnosis not present

## 2023-10-31 ENCOUNTER — Encounter: Payer: Self-pay | Admitting: Family Medicine

## 2023-11-22 ENCOUNTER — Ambulatory Visit

## 2024-01-02 ENCOUNTER — Telehealth: Payer: Self-pay | Admitting: Family Medicine

## 2024-01-02 NOTE — Telephone Encounter (Signed)
 Copied from CRM [id number]. Topic: Medicare AWV >> Jan 02, 2024  2:56 PM Nathanel DEL wrote: Reason for CRM: Called 01/02/2024 to sched AWV - pt picks up no answer  Nathanel Paschal; Care Guide Ambulatory Clinical Support [facility] Direct Dial: [phone]

## 2024-02-05 ENCOUNTER — Other Ambulatory Visit: Payer: Self-pay | Admitting: Family Medicine

## 2024-02-05 ENCOUNTER — Encounter: Payer: Self-pay | Admitting: *Deleted

## 2024-02-05 DIAGNOSIS — F4321 Adjustment disorder with depressed mood: Secondary | ICD-10-CM

## 2024-02-06 MED ORDER — FLUOXETINE HCL 20 MG PO CAPS
40.0000 mg | ORAL_CAPSULE | Freq: Every day | ORAL | 0 refills | Status: AC
Start: 2024-02-06 — End: ?

## 2024-02-06 NOTE — Addendum Note (Signed)
 Addended by: ESTELLE GILLIS D on: 02/06/2024 08:27 AM   Modules accepted: Orders

## 2024-04-11 ENCOUNTER — Other Ambulatory Visit (HOSPITAL_BASED_OUTPATIENT_CLINIC_OR_DEPARTMENT_OTHER): Payer: Self-pay

## 2024-04-19 ENCOUNTER — Other Ambulatory Visit: Payer: Self-pay | Admitting: Family Medicine

## 2024-04-19 DIAGNOSIS — F4321 Adjustment disorder with depressed mood: Secondary | ICD-10-CM

## 2024-04-19 NOTE — Telephone Encounter (Signed)
 Rx denied. Pt informed in September that she was due for appt.   Estelle Gillis D, CMA to [facility]     02/05/24  8:55 AM Dr. Watt wanted to see you back in 6 months on around 02/09/24.  Please call to schedule your appointment.  Last read by Dene ORN Gearing at 9:42PM on 02/06/2024.

## 2024-06-05 ENCOUNTER — Ambulatory Visit (INDEPENDENT_AMBULATORY_CARE_PROVIDER_SITE_OTHER): Admitting: *Deleted

## 2024-06-05 VITALS — Ht 60.0 in | Wt 148.0 lb

## 2024-06-05 DIAGNOSIS — Z0000 Encounter for general adult medical examination without abnormal findings: Secondary | ICD-10-CM | POA: Diagnosis not present

## 2024-06-05 DIAGNOSIS — Z1231 Encounter for screening mammogram for malignant neoplasm of breast: Secondary | ICD-10-CM

## 2024-06-05 NOTE — Progress Notes (Signed)
 " Please attest this visit in the absence of patient primary care provider.   Chief Complaint  Patient presents with   Medicare Wellness     Subjective:   Victoria Hogan is a 74 y.o. female who presents for a Medicare Annual Wellness Visit.  Visit info / Clinical Intake: Medicare Wellness Visit Type:: Subsequent Annual Wellness Visit Persons participating in visit and providing information:: patient Medicare Wellness Visit Mode:: Telephone If telephone:: video declined Since this visit was completed virtually, some vitals may be partially provided or unavailable. Missing vitals are due to the limitations of the virtual format.: Unable to obtain vitals - no equipment If Telephone or Video please confirm:: I connected with patient using audio/video enable telemedicine. I verified patient identity with two identifiers, discussed telehealth limitations, and patient agreed to proceed. Patient Location:: home Provider Location:: office Interpreter Needed?: No Living arrangements:: (!) (Patient-Rptd) lives alone Patient's Overall Health Status Rating: (Patient-Rptd) good Typical amount of pain: (Patient-Rptd) some Does pain affect daily life?: (Patient-Rptd) no Are you currently prescribed opioids?: no  Dietary Habits and Nutritional Risks How many meals a day?: (Patient-Rptd) 2 Eats fruit and vegetables daily?: (Patient-Rptd) yes Most meals are obtained by: (Patient-Rptd) preparing own meals In the last 2 weeks, have you had any of the following?: none Diabetic:: no  Functional Status Activities of Daily Living (to include ambulation/medication): (Patient-Rptd) Independent Ambulation: Independent with device- listed below Home Assistive Devices/Equipment: [place] (only uses it in the shower) Medication Administration: (Patient-Rptd) Independent Home Management (perform basic housework or laundry): (Patient-Rptd) Independent Manage your own finances?: (Patient-Rptd) yes Primary  transportation is: (Patient-Rptd) driving Concerns about vision?: no *vision screening is required for WT[facility]* (up to date with Dr Abigail) Concerns about hearing?: no (has noticed changes but feels she is hearing sufficiently at this time)  Fall Screening Falls in the past year?: 1 (tripped over the curb) Number of falls in past year: (Patient-Rptd) 0 Was there an injury with Fall?: (Patient-Rptd) 0 Fall Risk Category Calculator: (Patient-Rptd) 1 Patient Fall Risk Level: (Patient-Rptd) Low Fall Risk  Fall Risk Patient at Risk for Falls Due to: History of fall(s) Fall risk Follow up: Falls evaluation completed  Home and Transportation Safety: All rugs have non-skid backing?: (Patient-Rptd) N/A, no rugs All stairs or steps have railings?: (Patient-Rptd) N/A, no stairs Grab bars in the bathtub or shower?: (Patient-Rptd) yes Have non-skid surface in bathtub or shower?: (Patient-Rptd) yes Good home lighting?: (Patient-Rptd) yes Regular seat belt use?: (Patient-Rptd) yes Hospital stays in the last year:: (Patient-Rptd) no  Cognitive Assessment Difficulty concentrating, remembering, or making decisions? : (Patient-Rptd) no Will 6CIT or Mini Cog be Completed: yes What year is it?: 0 points What month is it?: 0 points Give patient an address phrase to remember (5 components): [address], [place]  About what time is it?: 0 points Count backwards from 20 to 1: 0 points Say the months of the year in reverse: 0 points Repeat the address phrase from earlier: 0 points 6 CIT Score: 0 points  Advance Directives (For Healthcare) Does Patient Have a Medical Advance Directive?: No Would patient like information on creating a medical advance directive?: Yes (MAU/Ambulatory/Procedural Areas - Information given)  Reviewed/Updated  Reviewed/Updated: Reviewed All (Medical, Surgical, Family, Medications, Allergies, Care Teams, Patient Goals)    Allergies (verified) Patient has no  known allergies.   Current Medications (verified) Outpatient Encounter Medications as of 06/05/2024  Medication Sig   alendronate  (FOSAMAX ) 70 MG tablet TAKE 1 TABLET BY MOUTH WEEKLY,  TAKE WITH A FULL GLASS OF WATER ON EMTPY STOMACH   FLUoxetine  (PROZAC ) 20 MG capsule Take 2 capsules (40 mg total) by mouth daily.   lisinopril -hydrochlorothiazide  (ZESTORETIC ) 10-12.5 MG tablet TAKE 1 TABLET BY MOUTH EVERY DAY   lovastatin  (MEVACOR ) 20 MG tablet TAKE 1 TABLET BY MOUTH EVERYDAY AT BEDTIME   Melatonin 5 MG CHEW Chew 1 each by mouth at bedtime as needed.   Probiotic Product (PROBIOTIC ADVANCED PO) Take 1 capsule by mouth daily as needed.   [DISCONTINUED] cephALEXin  (KEFLEX ) 500 MG capsule Take 1 capsule (500 mg total) by mouth 2 (two) times daily.   [DISCONTINUED] Cholecalciferol  (VITAMIN D3) 1.25 MG (50000 UT) CAPS Take 1 capsule (50,000 Units total) by mouth once a week for 12 weeks.   No facility-administered encounter medications on file as of 06/05/2024.    History: Past Medical History:  Diagnosis Date   Allergy    Anxiety    GERD (gastroesophageal reflux disease)    Heart murmur    Hemorrhoids    History of colonic diverticulitis    Hyperlipidemia    Hypertension    Osteoporosis    Pneumonia 02/2012   PVC (premature ventricular contraction)    Past Surgical History:  Procedure Laterality Date   ABDOMINAL HYSTERECTOMY     bunyonectomy     Family History  Problem Relation Age of Onset   Arthritis Mother    Heart failure Father    Colon cancer Neg Hx    Esophageal cancer Neg Hx    Stomach cancer Neg Hx    Rectal cancer Neg Hx    Social History   Occupational History   Occupation: Magazine Features Editor: [employer]  Tobacco Use   Smoking status: Never   Smokeless tobacco: Never  Vaping Use   Vaping status: Never Used  Substance and Sexual Activity   Alcohol use: Yes    Alcohol/week: 4.0 standard drinks of alcohol    Types: 4 Glasses of wine per week     Comment: Sometimes more sometimes less   Drug use: Never   Sexual activity: Not Currently   Tobacco Counseling Counseling given: Not Answered  SDOH Screenings   Food Insecurity: Food Insecurity Present (06/05/2024)  Housing: High Risk (06/05/2024)  Transportation Needs: No Transportation Needs (06/05/2024)  Utilities: Not At Risk (06/05/2024)  Alcohol Screen: Low Risk (06/05/2024)  Depression (PHQ2-9): Low Risk (06/05/2024)  Financial Resource Strain: High Risk (06/05/2024)  Physical Activity: Insufficiently Active (06/05/2024)  Social Connections: Moderately Integrated (06/05/2024)  Stress: No Stress Concern Present (06/05/2024)  Tobacco Use: Low Risk (06/05/2024)   See flowsheets for full screening details  Depression Screen PHQ 2 & 9 Depression Scale- Over the past 2 weeks, how often have you been bothered by any of the following problems? Little interest or pleasure in doing things: 0 Feeling down, depressed, or hopeless (PHQ Adolescent also includes...irritable): 0 PHQ-2 Total Score: 0 Trouble falling or staying asleep, or sleeping too much: 1 (takes melatonin) Feeling tired or having little energy: 0 Poor appetite or overeating (PHQ Adolescent also includes...weight loss): 0 Feeling bad about yourself - or that you are a failure or have let yourself or your family down: 3 (Doesn't let her thoughts interfere with her daily activities) Trouble concentrating on things, such as reading the newspaper or watching television (PHQ Adolescent also includes...like school work): 0 Moving or speaking so slowly that other people could have noticed. Or the opposite - being so fidgety or restless that you  have been moving around a lot more than usual: 0 Thoughts that you would be better off dead, or of hurting yourself in some way: 0 PHQ-9 Total Score: 4 If you checked off any problems, how difficult have these problems made it for you to do your work, take care of things at home, or get along with  other people?: Not difficult at all     Goals Addressed             This Visit's Progress    Increase physical activity   On track    To maintain current level of exercise of 4 days a week and 20 min a day               Objective:    Today's Vitals   06/05/24 1111  Weight: 148 lb (67.1 kg)  Height: 5' (1.524 m)   Body mass index is 28.9 kg/m.  Hearing/Vision screen No results found. Immunizations and Health Maintenance Health Maintenance  Topic Date Due   DTaP/Tdap/Td (2 - Td or Tdap) 12/06/2022   COVID-19 Vaccine (10 - 2025-26 season) 09/29/2024   Medicare Annual Wellness (AWV)  06/05/2025   Mammogram  09/24/2025   Colonoscopy  10/26/2027   Pneumococcal Vaccine: 50+ Years  Completed   Influenza Vaccine  Completed   Bone Density Scan  Completed   Hepatitis C Screening  Completed   Zoster Vaccines- Shingrix  Completed   Meningococcal B Vaccine  Aged Out        Assessment/Plan:  This is a routine wellness examination for [place].  Patient Care Team: Copland, Harlene BROCKS, MD as PCP - General (Family Medicine) Rollin Dover, MD as Attending Physician (Gastroenterology)  I have personally reviewed and noted the following in the patients chart:   Medical and social history Use of alcohol, tobacco or illicit drugs  Current medications and supplements including opioid prescriptions. Functional ability and status Nutritional status Physical activity Advanced directives List of other physicians Hospitalizations, surgeries, and ER visits in previous 12 months Vitals Screenings to include cognitive, depression, and falls Referrals and appointments  Orders Placed This Encounter  Procedures   MM 3D SCREENING MAMMOGRAM BILATERAL BREAST    Standing Status:   Future    Expected Date:   09/25/2024    Expiration Date:   06/05/2025    Reason for Exam (SYMPTOM  OR DIAGNOSIS REQUIRED):   breast cancer screening    Preferred imaging location?:   [facility]    In addition, I have reviewed and discussed with patient certain preventive protocols, quality metrics, and best practice recommendations. A written personalized care plan for preventive services as well as general preventive health recommendations were provided to patient.   Lolita Libra, CMA   06/05/2024   Return in 1 year (on 06/05/2025).  After Visit Summary: (MyChart) Due to this being a telephonic visit, the after visit summary with patients personalized plan was offered to patient via MyChart   Nurse Notes: HM Addressed: Vaccines Due: will get tetanus at pharmacy Mammogram ordered  "

## 2024-06-05 NOTE — Patient Instructions (Addendum)
 Ms. Victoria Hogan,  Thank you for taking the time for your Medicare Wellness Visit. I appreciate your continued commitment to your health goals. Please review the care plan we discussed, and feel free to reach out if I can assist you further.  Please note that Annual Wellness Visits do not include a physical exam. Some assessments may be limited, especially if the visit was conducted virtually. If needed, we may recommend an in-person follow-up with your provider.  Ongoing Care Seeing your primary care provider every 3 to 6 months helps us  monitor your health and provide consistent, personalized care.   Dr Watt: 08/14/24 11am Medicare AWV: 06/06/25 11am, mychart video visit   Referrals If a referral was made during today's visit and you haven't received any updates within two weeks, please contact the referred provider directly to check on the status.  Mammogram due 09/25/24 [facility]): [phone]  Recommended Screenings:  You will need to get the following vaccines at your local pharmacy: Tetanus  Health Maintenance  Topic Date Due   Medicare Annual Wellness Visit  11/03/2022   DTaP/Tdap/Td vaccine (2 - Td or Tdap) 12/06/2022   COVID-19 Vaccine (10 - 2025-26 season) 09/29/2024   Breast Cancer Screening  09/24/2025   Colon Cancer Screening  10/26/2027   Pneumococcal Vaccine for age over 13  Completed   Flu Shot  Completed   Osteoporosis screening with Bone Density Scan  Completed   Hepatitis C Screening  Completed   Zoster (Shingles) Vaccine  Completed   Meningitis B Vaccine  Aged Out       06/05/2024   10:22 AM  Advanced Directives  Does Patient Have a Medical Advance Directive? No  Would patient like information on creating a medical advance directive? Yes (MAU/Ambulatory/Procedural Areas - Information given)   I am mailing your packet of Advanced Directives today. Please let us  know if you do not get it within a week. When you are ready to have the Directives  signed and notarized you may call our office at [phone] and ask to speak with Thersia at our front desk. She will let you know the best time to come in and complete the process. You will not need to bring witnesses with you as our staff can be your witness.  Vision: Annual vision screenings are recommended for early detection of glaucoma, cataracts, and diabetic retinopathy. These exams can also reveal signs of chronic conditions such as diabetes and high blood pressure.  Dental: Annual dental screenings help detect early signs of oral cancer, gum disease, and other conditions linked to overall health, including heart disease and diabetes.  Please see the attached documents for additional preventive care recommendations.

## 2024-08-14 ENCOUNTER — Encounter: Admitting: Family Medicine

## 2024-10-01 ENCOUNTER — Ambulatory Visit (HOSPITAL_BASED_OUTPATIENT_CLINIC_OR_DEPARTMENT_OTHER)

## 2025-06-06 ENCOUNTER — Ambulatory Visit
